# Patient Record
Sex: Male | Born: 1980 | Race: White | Hispanic: No | Smoking: Current every day smoker
Health system: Southern US, Community
[De-identification: ages and names within clinical notes are randomized; demographics above are authoritative.]

## PROBLEM LIST (undated history)

## (undated) DIAGNOSIS — F17200 Nicotine dependence, unspecified, uncomplicated: Secondary | ICD-10-CM

## (undated) DIAGNOSIS — S129XXA Fracture of neck, unspecified, initial encounter: Secondary | ICD-10-CM

## (undated) DIAGNOSIS — S46212A Strain of muscle, fascia and tendon of other parts of biceps, left arm, initial encounter: Secondary | ICD-10-CM

## (undated) HISTORY — PX: TONSILLECTOMY: SUR1361

---

## 2006-08-29 ENCOUNTER — Emergency Department (HOSPITAL_COMMUNITY): Admission: EM | Admit: 2006-08-29 | Discharge: 2006-08-29 | Payer: Self-pay | Admitting: Family Medicine

## 2007-01-11 ENCOUNTER — Emergency Department (HOSPITAL_COMMUNITY): Admission: EM | Admit: 2007-01-11 | Discharge: 2007-01-11 | Payer: Self-pay | Admitting: Emergency Medicine

## 2007-01-16 ENCOUNTER — Ambulatory Visit (HOSPITAL_COMMUNITY): Admission: RE | Admit: 2007-01-16 | Discharge: 2007-01-16 | Payer: Self-pay | Admitting: Orthopaedic Surgery

## 2009-11-16 ENCOUNTER — Emergency Department (HOSPITAL_COMMUNITY): Admission: EM | Admit: 2009-11-16 | Discharge: 2009-11-16 | Payer: Self-pay | Admitting: Emergency Medicine

## 2010-08-14 ENCOUNTER — Inpatient Hospital Stay (HOSPITAL_COMMUNITY)
Admission: EM | Admit: 2010-08-14 | Discharge: 2010-08-20 | Payer: Self-pay | Source: Home / Self Care | Admitting: Emergency Medicine

## 2010-08-14 ENCOUNTER — Encounter: Payer: Self-pay | Admitting: Emergency Medicine

## 2010-11-18 ENCOUNTER — Other Ambulatory Visit (HOSPITAL_COMMUNITY): Payer: Self-pay | Admitting: Neurosurgery

## 2010-11-18 DIAGNOSIS — T148XXA Other injury of unspecified body region, initial encounter: Secondary | ICD-10-CM

## 2010-11-19 ENCOUNTER — Ambulatory Visit (HOSPITAL_COMMUNITY): Admission: RE | Admit: 2010-11-19 | Payer: Self-pay | Source: Ambulatory Visit

## 2010-11-19 ENCOUNTER — Other Ambulatory Visit (HOSPITAL_COMMUNITY): Payer: Self-pay | Admitting: Neurosurgery

## 2010-11-19 ENCOUNTER — Ambulatory Visit (HOSPITAL_COMMUNITY)
Admission: RE | Admit: 2010-11-19 | Discharge: 2010-11-19 | Disposition: A | Discharge status: Home / Self Care | Payer: Self-pay | Source: Ambulatory Visit | Attending: Neurosurgery | Admitting: Neurosurgery

## 2010-11-19 DIAGNOSIS — M8448XA Pathological fracture, other site, initial encounter for fracture: Secondary | ICD-10-CM | POA: Insufficient documentation

## 2010-11-19 DIAGNOSIS — T148XXA Other injury of unspecified body region, initial encounter: Secondary | ICD-10-CM

## 2010-11-19 DIAGNOSIS — S129XXA Fracture of neck, unspecified, initial encounter: Secondary | ICD-10-CM

## 2010-11-19 DIAGNOSIS — S22009A Unspecified fracture of unspecified thoracic vertebra, initial encounter for closed fracture: Secondary | ICD-10-CM

## 2010-12-14 LAB — BASIC METABOLIC PANEL
CO2: 27 mEq/L (ref 19–32)
Chloride: 99 mEq/L (ref 96–112)
GFR calc non Af Amer: >60 mL/min (ref >60)
Glucose, Bld: 126 mg/dL — ABNORMAL HIGH (ref 70–99)
Glucose, Bld: 119 mg/dL — ABNORMAL HIGH (ref 70–99)
Potassium: 4.3 mEq/L (ref 3.5–5.1)
Potassium: 3.9 mEq/L (ref 3.5–5.1)
Sodium: 137 mEq/L (ref 135–145)
Sodium: 139 mEq/L (ref 135–145)

## 2010-12-14 LAB — URINE MICROSCOPIC-ADD ON

## 2010-12-14 LAB — URINALYSIS, ROUTINE W REFLEX MICROSCOPIC
Bilirubin Urine: NEGATIVE
Specific Gravity, Urine: <1.005 — ABNORMAL LOW (ref 1.005–1.030)
Urobilinogen, UA: 0.2 mg/dL (ref 0.0–1.0)

## 2010-12-14 LAB — DIFFERENTIAL
Basophils Relative: 0 % (ref 0–1)
Eosinophils Absolute: 0 10*3/uL (ref 0.0–0.7)
Monocytes Absolute: 0.4 10*3/uL (ref 0.1–1.0)
Monocytes Relative: 3 % (ref 3–12)

## 2010-12-14 LAB — CBC
HCT: 43.7 % (ref 39.0–52.0)
HCT: 49.1 % (ref 39.0–52.0)
Hemoglobin: 14.7 g/dL (ref 13.0–17.0)
Hemoglobin: 16.6 g/dL (ref 13.0–17.0)
MCH: 32.8 pg (ref 26.0–34.0)
MCHC: 33.7 g/dL (ref 30.0–36.0)
MCV: 97.2 fL (ref 78.0–100.0)
WBC: 7.2 10*3/uL (ref 4.0–10.5)

## 2010-12-14 LAB — RAPID URINE DRUG SCREEN, HOSP PERFORMED
Cocaine: NOT DETECTED
Opiates: NOT DETECTED
Tetrahydrocannabinol: NOT DETECTED

## 2011-10-14 ENCOUNTER — Emergency Department (HOSPITAL_COMMUNITY)
Admission: EM | Admit: 2011-10-14 | Discharge: 2011-10-14 | Disposition: A | Discharge status: Home / Self Care | Payer: Self-pay | Attending: Emergency Medicine | Admitting: Emergency Medicine

## 2011-10-14 ENCOUNTER — Encounter (HOSPITAL_COMMUNITY): Payer: Self-pay | Admitting: Emergency Medicine

## 2011-10-14 DIAGNOSIS — K0889 Other specified disorders of teeth and supporting structures: Secondary | ICD-10-CM

## 2011-10-14 DIAGNOSIS — K029 Dental caries, unspecified: Secondary | ICD-10-CM | POA: Insufficient documentation

## 2011-10-14 DIAGNOSIS — S025XXA Fracture of tooth (traumatic), initial encounter for closed fracture: Secondary | ICD-10-CM | POA: Insufficient documentation

## 2011-10-14 DIAGNOSIS — X58XXXA Exposure to other specified factors, initial encounter: Secondary | ICD-10-CM | POA: Insufficient documentation

## 2011-10-14 DIAGNOSIS — K089 Disorder of teeth and supporting structures, unspecified: Secondary | ICD-10-CM | POA: Insufficient documentation

## 2011-10-14 MED ORDER — HYDROCODONE-ACETAMINOPHEN 5-500 MG PO TABS: 1.0000 | ORAL_TABLET | Freq: Four times a day (QID) | ORAL | Status: AC | PRN

## 2011-10-14 MED ORDER — CLINDAMYCIN HCL 150 MG PO CAPS: 300.0000 mg | ORAL_CAPSULE | Freq: Three times a day (TID) | ORAL | Status: AC

## 2011-10-14 MED ORDER — NAPROXEN 500 MG PO TABS: 500.0000 mg | ORAL_TABLET | Freq: Two times a day (BID) | ORAL | Status: AC

## 2011-10-14 MED ORDER — HYDROCODONE-ACETAMINOPHEN 5-325 MG PO TABS
2.0000 | ORAL_TABLET | Freq: Once | ORAL | Status: AC
  Administered 2011-10-14: 2 via ORAL
  Filled 2011-10-14: qty 2

## 2011-10-14 NOTE — ED Provider Notes (Signed)
History     CSN: [id number]  Arrival date & time 10/14/11  0449   First MD Initiated Contact with Patient 10/14/11 0510      Chief Complaint  Patient presents with  . Dental Pain    (Consider location/radiation/quality/duration/timing/severity/associated sxs/prior treatment) HPI Comments: 31 year old male with no significant past medical history who presents with several weeks of left lower second molar pain. It has been mild, waxes and wanes and is worse with chewing. This evening approximately 4 hours prior to evaluation the patient noted significant increase in the pain. It is not associated with fevers nausea vomiting or swelling. Symptoms are persistent and he has not been evaluated by a dentist for same pain. He has tried ibuprofen prior to arrival with mild  improvement  Patient is a 31 y.o. male presenting with tooth pain. The history is provided by the patient and a friend.  Dental PainPrimary symptoms do not include fever or sore throat.  Additional symptoms do not include: facial swelling and trouble swallowing.    History reviewed. No pertinent past medical history.  History reviewed. No pertinent past surgical history.  No family history on file.  History  Substance Use Topics  . Smoking status: Current Everyday Smoker -- 1.0 packs/day    Types: Cigarettes  . Smokeless tobacco: Not on file  . Alcohol Use: Yes      Review of Systems  Constitutional: Negative for fever and chills.  HENT: Positive for dental problem. Negative for sore throat, facial swelling, trouble swallowing and voice change.        Toothache  Gastrointestinal: Negative for nausea and vomiting.    Allergies  Penicillins  Home Medications   Current Outpatient Rx  Name Route Sig Dispense Refill  . CLINDAMYCIN HCL 150 MG PO CAPS Oral Take 2 capsules (300 mg total) by mouth 3 (three) times daily. May dispense as 150mg  capsules 60 capsule 0  . HYDROCODONE-ACETAMINOPHEN 5-500 MG PO TABS  Oral Take 1-2 tablets by mouth every 6 (six) hours as needed for pain. 15 tablet 0  . NAPROXEN 500 MG PO TABS Oral Take 1 tablet (500 mg total) by mouth 2 (two) times daily with a meal. 30 tablet 0    BP 136/87  Pulse 67  Temp(Src) 98 F (36.7 C) (Oral)  Resp 20  Ht 6\' 3"  (1.905 m)  Wt 220 lb (99.791 kg)  BMI 27.50 kg/m2  SpO2 100%  Physical Exam  Nursing note and vitals reviewed. Constitutional: He appears well-developed and well-nourished. No distress.  HENT:  Head: Normocephalic and atraumatic.  Mouth/Throat: Oropharynx is clear and moist. No oropharyngeal exudate.       Dental Disease - scattered caries, small dental fracture to left lower second molar, no periapical abscess, swelling. No sublingual tenderness or pain. No asymmetry or tenderness to the mandible  Eyes: Conjunctivae are normal. No scleral icterus.  Neck: Normal range of motion. Neck supple. No thyromegaly present.  Cardiovascular: Normal rate and regular rhythm.   Pulmonary/Chest: Effort normal and breath sounds normal.  Lymphadenopathy:    He has no cervical adenopathy.  Neurological: He is alert.  Skin: Skin is warm and dry. No rash noted. He is not diaphoretic.    ED Course  Procedures (including critical care time)  Labs Reviewed - No data to display No results found.   1. Fracture of tooth   2. Pain, dental       MDM  Patient has apparent dental fracture, there is no signs of  abscess. He has a penicillin allergy and thus I will prescribe clindamycin. He will also be treated with hydrocodone and Naprosyn. I have given dental contact information for followup.  There is no physical evidence to suggest a Ludwig angina and the pharynx is widely patent. Vital signs reveal no tachycardia or fever.  Discharge prescriptions  #1 Naprosyn #2 hydrocodone #3 clindamycin        Vida Roller, MD 10/14/11 [phone]

## 2011-10-14 NOTE — ED Notes (Signed)
Medicated as ordered for 10\10 pain. Denies any needs. No distress. Call bell and wife at bedside.

## 2011-10-14 NOTE — ED Notes (Signed)
Patient c/o left lower dental pain since 0130.

## 2012-10-14 ENCOUNTER — Encounter (HOSPITAL_COMMUNITY): Payer: Self-pay | Admitting: *Deleted

## 2012-10-14 ENCOUNTER — Emergency Department (HOSPITAL_COMMUNITY)
Admission: EM | Admit: 2012-10-14 | Discharge: 2012-10-14 | Disposition: A | Discharge status: Home / Self Care | Payer: No Typology Code available for payment source | Attending: Emergency Medicine | Admitting: Emergency Medicine

## 2012-10-14 ENCOUNTER — Emergency Department (HOSPITAL_COMMUNITY): Payer: No Typology Code available for payment source

## 2012-10-14 DIAGNOSIS — F17200 Nicotine dependence, unspecified, uncomplicated: Secondary | ICD-10-CM | POA: Insufficient documentation

## 2012-10-14 DIAGNOSIS — Z79899 Other long term (current) drug therapy: Secondary | ICD-10-CM | POA: Insufficient documentation

## 2012-10-14 DIAGNOSIS — Y9381 Activity, refereeing a sports activity: Secondary | ICD-10-CM | POA: Insufficient documentation

## 2012-10-14 DIAGNOSIS — Z8739 Personal history of other diseases of the musculoskeletal system and connective tissue: Secondary | ICD-10-CM | POA: Insufficient documentation

## 2012-10-14 DIAGNOSIS — S161XXA Strain of muscle, fascia and tendon at neck level, initial encounter: Secondary | ICD-10-CM

## 2012-10-14 DIAGNOSIS — S139XXA Sprain of joints and ligaments of unspecified parts of neck, initial encounter: Secondary | ICD-10-CM | POA: Insufficient documentation

## 2012-10-14 MED ORDER — ONDANSETRON 8 MG PO TBDP
8.0000 mg | ORAL_TABLET | Freq: Once | ORAL | Status: AC
  Administered 2012-10-14: 8 mg via ORAL
  Filled 2012-10-14: qty 1

## 2012-10-14 MED ORDER — CYCLOBENZAPRINE HCL 5 MG PO TABS: 5.0000 mg | ORAL_TABLET | Freq: Three times a day (TID) | ORAL | Status: DC | PRN

## 2012-10-14 MED ORDER — HYDROCODONE-ACETAMINOPHEN 5-325 MG PO TABS
1.0000 | ORAL_TABLET | Freq: Once | ORAL | Status: AC
  Administered 2012-10-14: 1 via ORAL
  Filled 2012-10-14: qty 1

## 2012-10-14 MED ORDER — HYDROCODONE-ACETAMINOPHEN 5-325 MG PO TABS: 1.0000 | ORAL_TABLET | ORAL | Status: DC | PRN
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
## 2012-10-19 NOTE — ED Provider Notes (Signed)
Medical screening examination/treatment/procedure(s) were performed by non-physician practitioner and as supervising physician I was immediately available for consultation/collaboration.   Delrick Dehart M Lowell Mcgurk, MD 10/19/12 2144 

## 2013-04-02 ENCOUNTER — Emergency Department (HOSPITAL_COMMUNITY): Payer: 59

## 2013-04-02 ENCOUNTER — Encounter (HOSPITAL_COMMUNITY): Payer: Self-pay | Admitting: *Deleted

## 2013-04-02 ENCOUNTER — Emergency Department (HOSPITAL_COMMUNITY)
Admission: EM | Admit: 2013-04-02 | Discharge: 2013-04-03 | Disposition: A | Discharge status: Home / Self Care | Payer: 59 | Attending: Emergency Medicine | Admitting: Emergency Medicine

## 2013-04-02 DIAGNOSIS — R079 Chest pain, unspecified: Secondary | ICD-10-CM | POA: Insufficient documentation

## 2013-04-02 DIAGNOSIS — R51 Headache: Secondary | ICD-10-CM | POA: Insufficient documentation

## 2013-04-02 DIAGNOSIS — R509 Fever, unspecified: Secondary | ICD-10-CM | POA: Insufficient documentation

## 2013-04-02 DIAGNOSIS — M542 Cervicalgia: Secondary | ICD-10-CM | POA: Insufficient documentation

## 2013-04-02 DIAGNOSIS — Z880 Allergy status to penicillin: Secondary | ICD-10-CM | POA: Insufficient documentation

## 2013-04-02 DIAGNOSIS — IMO0001 Reserved for inherently not codable concepts without codable children: Secondary | ICD-10-CM | POA: Insufficient documentation

## 2013-04-02 DIAGNOSIS — R110 Nausea: Secondary | ICD-10-CM | POA: Insufficient documentation

## 2013-04-02 DIAGNOSIS — F17200 Nicotine dependence, unspecified, uncomplicated: Secondary | ICD-10-CM | POA: Insufficient documentation

## 2013-04-02 DIAGNOSIS — H53149 Visual discomfort, unspecified: Secondary | ICD-10-CM | POA: Insufficient documentation

## 2013-04-02 DIAGNOSIS — Z8781 Personal history of (healed) traumatic fracture: Secondary | ICD-10-CM | POA: Insufficient documentation

## 2013-04-02 HISTORY — DX: Fracture of neck, unspecified, initial encounter: S12.9XXA

## 2013-04-02 LAB — URINALYSIS, ROUTINE W REFLEX MICROSCOPIC
Glucose, UA: NEGATIVE mg/dL
Hgb urine dipstick: NEGATIVE
Leukocytes, UA: NEGATIVE
Specific Gravity, Urine: 1.01 (ref 1.005–1.030)

## 2013-04-02 LAB — CBC WITH DIFFERENTIAL/PLATELET
Basophils Absolute: 0 10*3/uL (ref 0.0–0.1)
Basophils Relative: 0 % (ref 0–1)
Eosinophils Absolute: 0 10*3/uL (ref 0.0–0.7)
Eosinophils Relative: 0 % (ref 0–5)
HCT: 43.4 % (ref 39.0–52.0)
MCH: 32.2 pg (ref 26.0–34.0)
MCHC: 35.7 g/dL (ref 30.0–36.0)
MCV: 90.2 fL (ref 78.0–100.0)
Monocytes Absolute: 0.3 10*3/uL (ref 0.1–1.0)
Neutro Abs: 1.4 10*3/uL — ABNORMAL LOW (ref 1.7–7.7)
RDW: 12.4 % (ref 11.5–15.5)

## 2013-04-02 LAB — COMPREHENSIVE METABOLIC PANEL
AST: 40 U/L — ABNORMAL HIGH (ref 0–37)
Albumin: 3.2 g/dL — ABNORMAL LOW (ref 3.5–5.2)
Calcium: 8.8 mg/dL (ref 8.4–10.5)
Creatinine, Ser: 0.91 mg/dL (ref 0.50–1.35)
Total Protein: 6 g/dL (ref 6.0–8.3)

## 2013-04-02 LAB — PROTEIN AND GLUCOSE, CSF: Total  Protein, CSF: 28 mg/dL (ref 15–45)

## 2013-04-02 LAB — CSF CELL COUNT WITH DIFFERENTIAL: Tube #: 1

## 2013-04-02 LAB — URINE MICROSCOPIC-ADD ON

## 2013-04-02 MED ORDER — MIDAZOLAM HCL 2 MG/2ML IJ SOLN
2.0000 mg | Freq: Once | INTRAMUSCULAR | Status: DC
  Filled 2013-04-02: qty 4

## 2013-04-02 MED ORDER — HYDROMORPHONE HCL PF 1 MG/ML IJ SOLN: 1.0000 mg | Freq: Once | INTRAMUSCULAR | Status: AC

## 2013-04-02 MED ORDER — MORPHINE SULFATE 4 MG/ML IJ SOLN
4.0000 mg | Freq: Once | INTRAMUSCULAR | Status: AC
  Administered 2013-04-02: 4 mg via INTRAVENOUS
  Filled 2013-04-02: qty 1

## 2013-04-02 MED ORDER — PROMETHAZINE HCL 25 MG PO TABS: 25.0000 mg | ORAL_TABLET | Freq: Four times a day (QID) | ORAL | Status: DC | PRN

## 2013-04-02 MED ORDER — HYDROMORPHONE HCL PF 1 MG/ML IJ SOLN
1.0000 mg | Freq: Once | INTRAMUSCULAR | Status: AC
  Administered 2013-04-02: 1 mg via INTRAVENOUS
  Filled 2013-04-02: qty 1

## 2013-04-02 MED ORDER — HYDROMORPHONE HCL PF 1 MG/ML IJ SOLN
1.0000 mg | Freq: Once | INTRAMUSCULAR | Status: AC
  Administered 2013-04-02: 1 mg via INTRAVENOUS

## 2013-04-02 MED ORDER — DOXYCYCLINE HYCLATE 100 MG PO CAPS: 100.0000 mg | ORAL_CAPSULE | Freq: Two times a day (BID) | ORAL | Status: DC

## 2013-04-02 MED ORDER — HYDROCODONE-ACETAMINOPHEN 5-325 MG PO TABS: 1.0000 | ORAL_TABLET | Freq: Four times a day (QID) | ORAL | Status: DC | PRN

## 2013-04-02 MED ORDER — METOCLOPRAMIDE HCL 5 MG/ML IJ SOLN
10.0000 mg | Freq: Once | INTRAMUSCULAR | Status: AC
  Administered 2013-04-02: 10 mg via INTRAVENOUS
  Filled 2013-04-02: qty 2

## 2013-04-02 MED ORDER — HYDROMORPHONE HCL PF 1 MG/ML IJ SOLN
INTRAMUSCULAR | Status: AC
  Administered 2013-04-02: 1 mg via INTRAVENOUS
  Filled 2013-04-02: qty 1

## 2013-04-02 MED ORDER — DEXAMETHASONE SODIUM PHOSPHATE 4 MG/ML IJ SOLN
10.0000 mg | Freq: Once | INTRAMUSCULAR | Status: AC
  Administered 2013-04-02: 10 mg via INTRAVENOUS
  Filled 2013-04-02: qty 3

## 2013-04-02 MED ORDER — SODIUM CHLORIDE 0.9 % IV SOLN
INTRAVENOUS | Status: AC | PRN
  Administered 2013-04-02: 1000 mL via INTRAVENOUS

## 2013-04-02 MED ORDER — MIDAZOLAM HCL 2 MG/2ML IJ SOLN
4.0000 mg | Freq: Once | INTRAMUSCULAR | Status: AC
  Administered 2013-04-02: 4 mg via INTRAVENOUS

## 2013-04-02 MED ORDER — ONDANSETRON HCL 4 MG/2ML IJ SOLN
4.0000 mg | Freq: Once | INTRAMUSCULAR | Status: AC
  Administered 2013-04-02: 4 mg via INTRAVENOUS
  Filled 2013-04-02: qty 2

## 2013-04-02 MED ORDER — DIPHENHYDRAMINE HCL 50 MG/ML IJ SOLN
25.0000 mg | Freq: Once | INTRAMUSCULAR | Status: AC
Start: 2013-04-02 — End: 2013-04-02
  Administered 2013-04-02: 25 mg via INTRAVENOUS
  Filled 2013-04-02: qty 1

## 2013-04-02 MED ORDER — SODIUM CHLORIDE 0.9 % IV SOLN
Freq: Once | INTRAVENOUS | Status: AC
  Administered 2013-04-02: 11:00:00 via INTRAVENOUS

## 2013-04-02 NOTE — ED Notes (Signed)
Pt. Con't. To c/o L upper chest pain 8/10, worse w/deep breath.  Dr. Estell Harpin made aware. He gave no further orders.  Patient and family made aware.

## 2013-04-02 NOTE — ED Notes (Signed)
Dr. Lynelle Doctor attempted LP unsuccessfully.  Patient stable.  Giving 1 L NS for SBP in the 90's

## 2013-04-02 NOTE — ED Notes (Signed)
HOB flat.

## 2013-04-02 NOTE — ED Notes (Signed)
Patient with no complaints at this time. Respirations even and unlabored. Skin warm/dry. Discharge instructions reviewed with patient at this time. Patient given opportunity to voice concerns/ask questions. IV removed per policy and band-aid applied to site. Patient discharged at this time and left Emergency Department with steady gait.  
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
## 2013-04-02 NOTE — ED Notes (Signed)
Pt c/o headache, fever as high as 104, nausea, chest pain at home that started Friday, headache is light sensitive,

## 2013-04-02 NOTE — ED Provider Notes (Signed)
Patient complains he started feeling bad 5 days ago with mild headache with neck stiffness The following day his headaches are getting worse and states his headache is at the top and back of his head. He also had fever up to 104. He denies any tick bites that he is aware of. He has had nausea without vomiting. He states he's never felt this way before.  Pt states his headache was better after the headache cocktail.   We discussed need for lumbar puncture to get spinal fluid to test for meningitis.  The patient was presedation given morphine and Versed. Timeout was called. Patient was laid on his right side. He was placed in the appropriate position. His back was cleaned with Betadine swabs x3. He was sterilely draped. The area was reaffirmed. He was injected with 1% Xylocaine and it appeared to be the appropriate angle to obtain fluid. However when I used a 20-gauge needle there was no fluid obtained. At one point there was blood he returned and attempts were stopped.  D/W Dr Tyron Russell he will do under flouroscopy  Pt turned over at change of shift to Dr Estell Harpin to get CSF results.   Dg Chest 2 View  04/02/2013   *RADIOLOGY REPORT*  Clinical Data: Headache and fevers  CHEST - 2 VIEW  Comparison: Chest CT August 14, 2010  Findings:  There is no edema or consolidation.  Heart size and pulmonary vascularity are normal.  No adenopathy.  There is mild anterior wedging of the T5 vertebral body.  IMPRESSION: No edema or consolidation.   Original Report Authenticated By: Bretta Bang, M.D.   Ct Head Wo Contrast  04/02/2013   *RADIOLOGY REPORT*  Clinical Data: Headache, fever, nausea  CT HEAD WITHOUT CONTRAST  Technique:  Contiguous axial images were obtained from the base of the skull through the vertex without contrast.  Comparison: CT head dated 08/14/2010  Findings: No evidence of parenchymal hemorrhage or extra-axial fluid collection. No mass lesion, mass effect, or midline shift.  No CT evidence of  acute infarction.  Cerebral volume is age appropriate.  No ventriculomegaly.  The visualized paranasal sinuses are essentially clear. The mastoid air cells are unopacified.  No evidence of calvarial fracture.  IMPRESSION: No evidence of acute intracranial abnormality.   Original Report Authenticated By: Charline Bills, M.D.      Diagnoses that have been ruled out:  None  Diagnoses that are still under consideration:  None  Final diagnoses:  Fever  Headache   Disposition pending   Medical screening examination/treatment/procedure(s) were conducted as a shared visit with non-physician practitioner(s) and myself.  I personally evaluated the patient during the encounter  Devoria Albe, MD, Franz Dell, MD 04/02/13 [phone]

## 2013-04-03 NOTE — ED Provider Notes (Signed)
Medical screening examination/treatment/procedure(s) were performed by non-physician practitioner and as supervising physician I was immediately available for consultation/collaboration. Devoria Albe, MD, FACEP   Ward Givens, MD 04/03/13 [phone]

## 2013-04-03 NOTE — Procedures (Signed)
Preprocedure Dx: Headache, fever Postprocedure Dx: HEadache, fever Procedure:  Fluoroscopically guided lumbar puncture Radiologist:  Tyron Russell Anesthesia:  2 ml of 1% lidocaine Specimen:  8.5 ml CSF, clear colorless EBL:   None Opening pressure: 17 cm H2O Complications: None

## 2013-04-07 LAB — CSF CULTURE W GRAM STAIN: Culture: NO GROWTH

## 2014-08-03 IMAGING — CR DG CHEST 2V
2 series · 2 of 2 positions shown · non-contrast
Comparison: Chest CT August 14, 2010

CLINICAL DATA: Headache and fevers

CHEST - 2 VIEW

[view not recorded (1 of 2)]
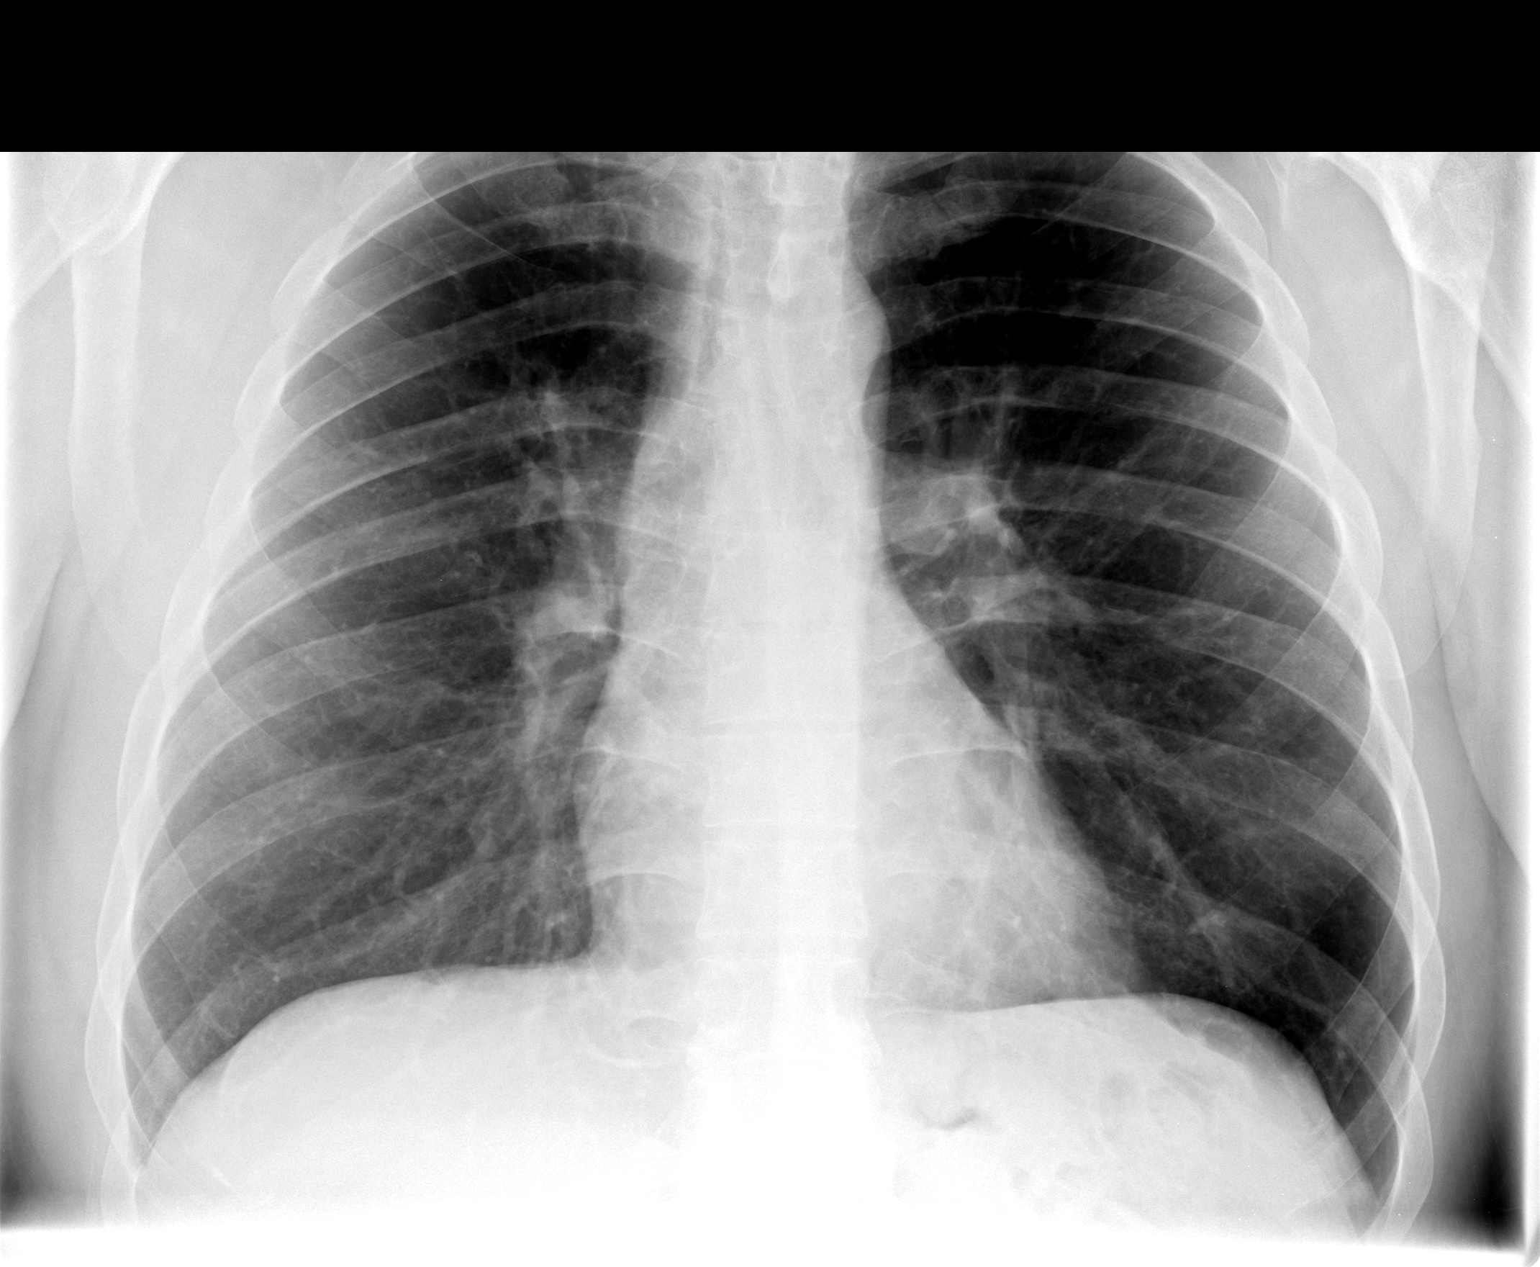

[view not recorded (2 of 2)]
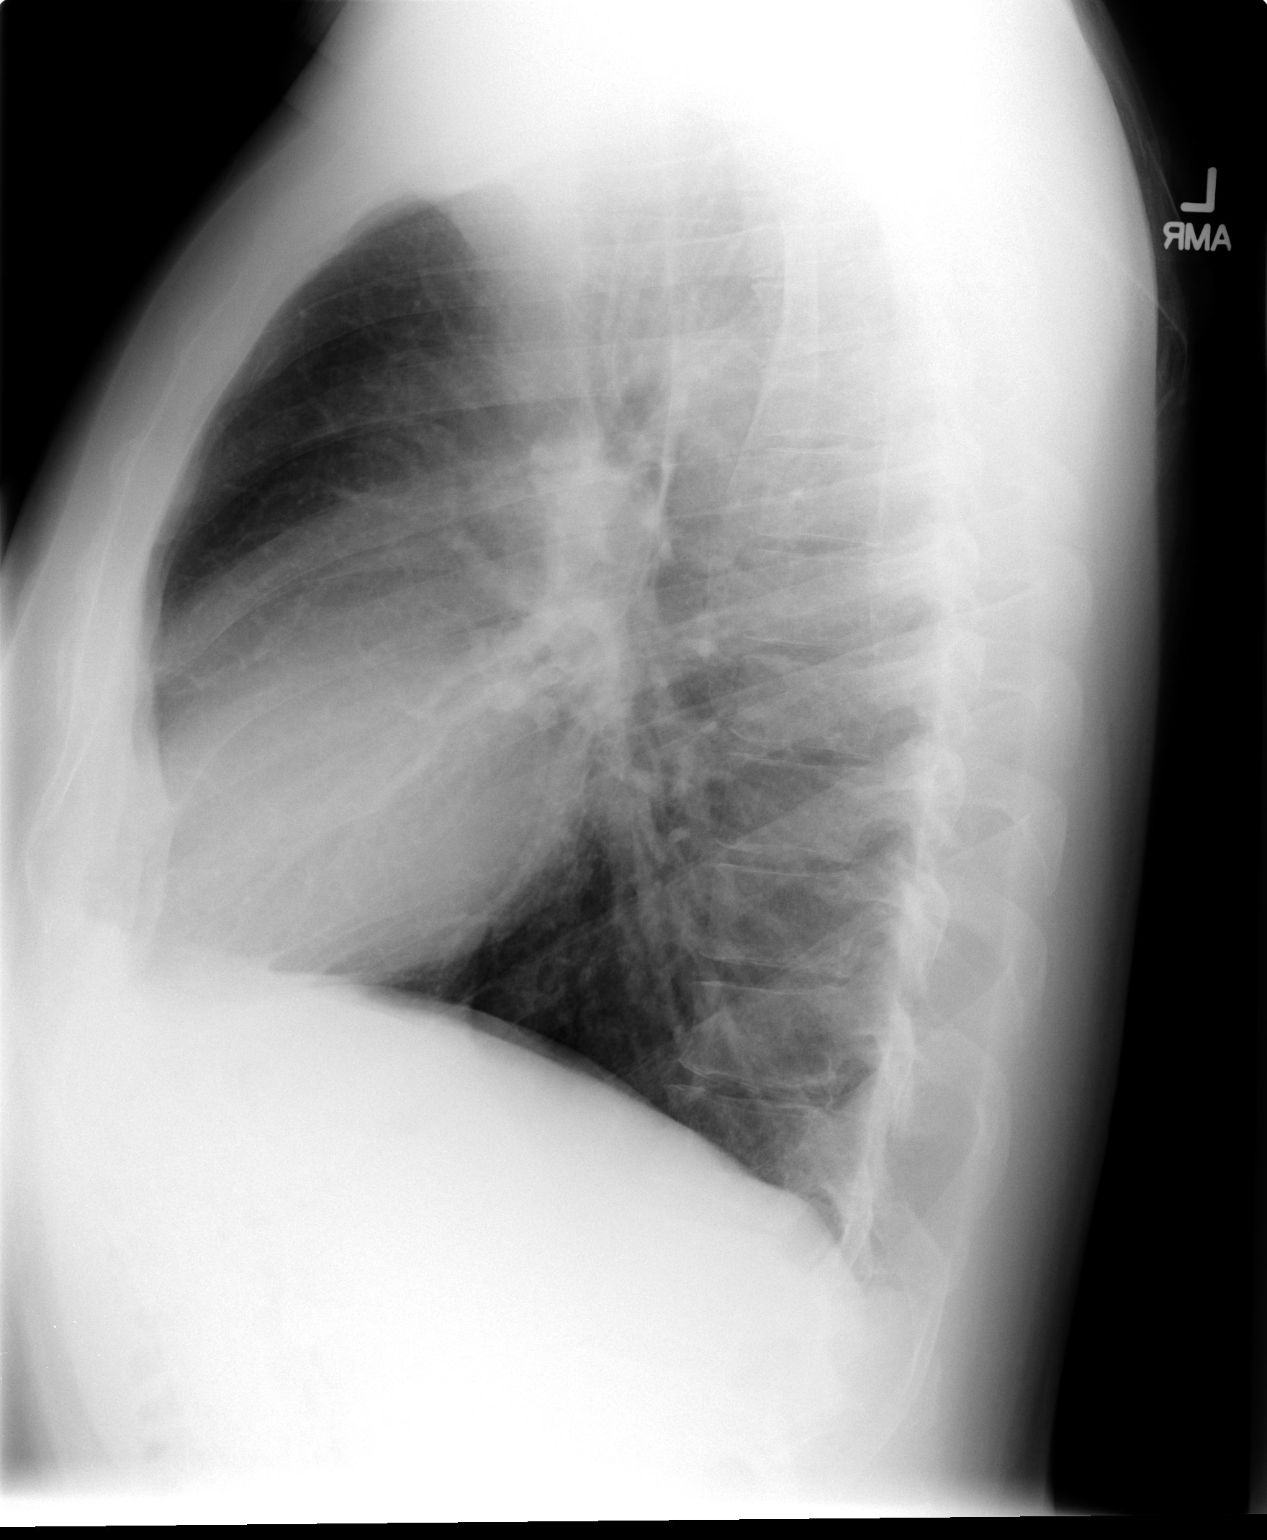

[2 of 2 positions shown; findings below may reference images not displayed]

FINDINGS: There is no edema or consolidation.  Heart size and
pulmonary vascularity are normal.  No adenopathy.  There is mild
anterior wedging of the T5 vertebral body.
IMPRESSION: No edema or consolidation.

## 2014-08-03 IMAGING — RF DG FLUORO GUIDE LUMBAR PUNCTURE
1 series · 1 of 1 positions shown · non-contrast
Comparison: none

CLINICAL DATA: Headache, fever

FLUOROSCOPIC GUIDED LUMBAR PUNCTURE:
TECHNIQUE: Written informed consent for fluoroscopic-guided lumbar puncture
was obtained.
Patient was placed prone.
L4-L5 disc space was localized under fluoroscopy.
Skin prepped and draped in usual sterile fashion.
Skin and soft tissues anesthetized with 2 ml of 1% lidocaine.
22 gauge spinal needle advanced into spinal canal.
Clear colorless CSF was encountered with opening pressure of 17 cm
H2O.
8.5 ml of CSF was obtained in four tubes for requested laboratory
analysis.
Procedure tolerated well by patient without immediate complication.
Fluoroscopy time: 0 minutes 24 seconds

[Series 1: run · 1 of 1 slices shown]
[im 1/1]
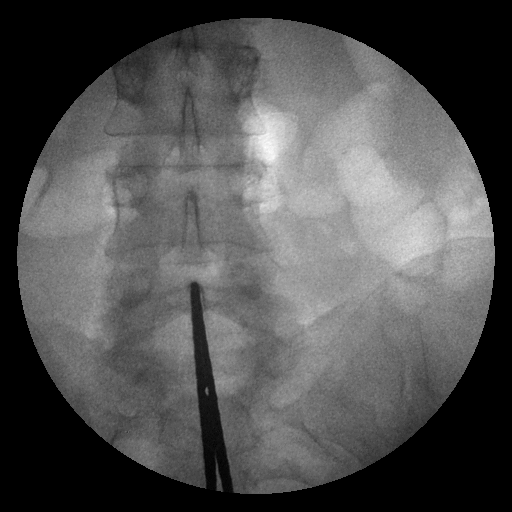

[1 of 1 positions shown; findings below may reference images not displayed]

IMPRESSION: Fluoroscopic guided lumbar puncture as above.
Fluid sent to laboratory for requested analysis.

## 2014-08-03 IMAGING — CT CT HEAD W/O CM
1 series · 16 of 30 positions shown, 20 images · non-contrast
Comparison: CT head dated 08/14/2010

CLINICAL DATA: Headache, fever, nausea

CT HEAD WITHOUT CONTRAST
TECHNIQUE: Contiguous axial images were obtained from the base of
the skull through the vertex without contrast.

[Series 2: headseq 4.8 h37s · axial · 0.44mm/px · z∈[+80,+238]mm · 16 of 36 slices shown, 20 images]
[im 2/36  brain]
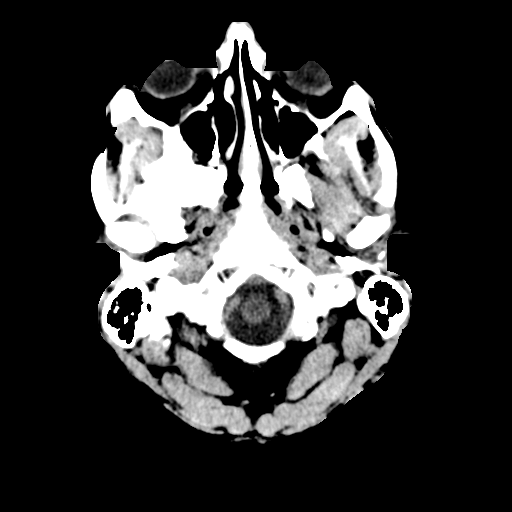
[im 2/36  bone]
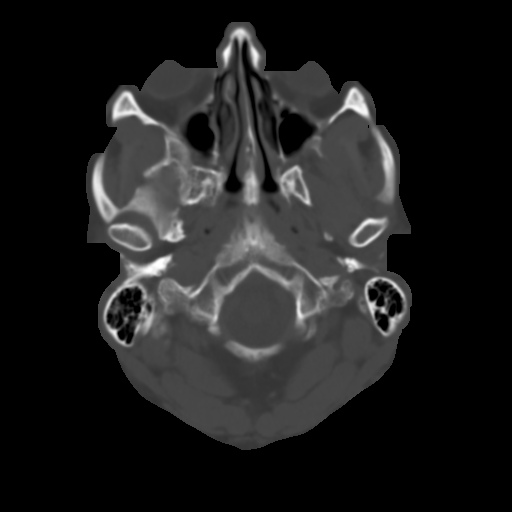
[im 4/36  brain]
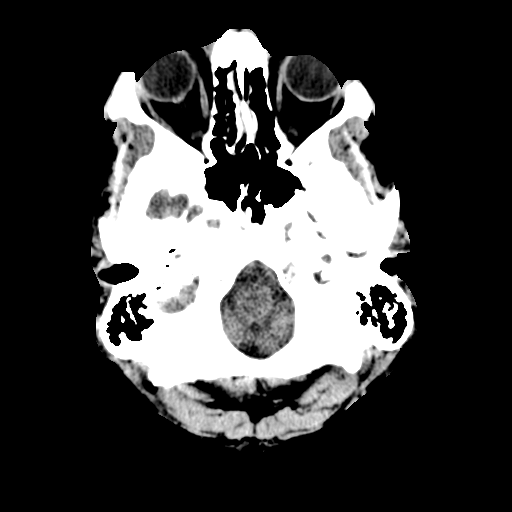
[im 7/36  brain]
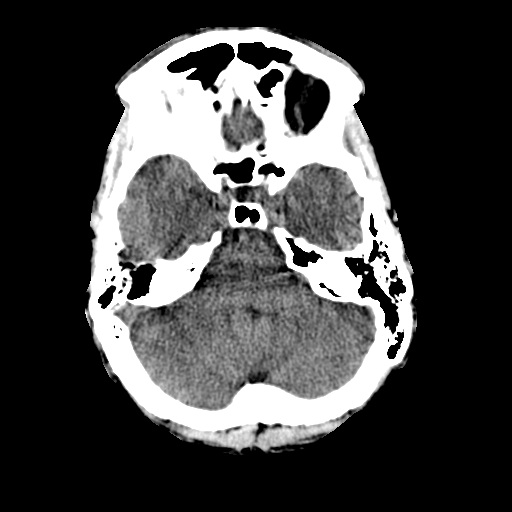
[im 9/36  brain]
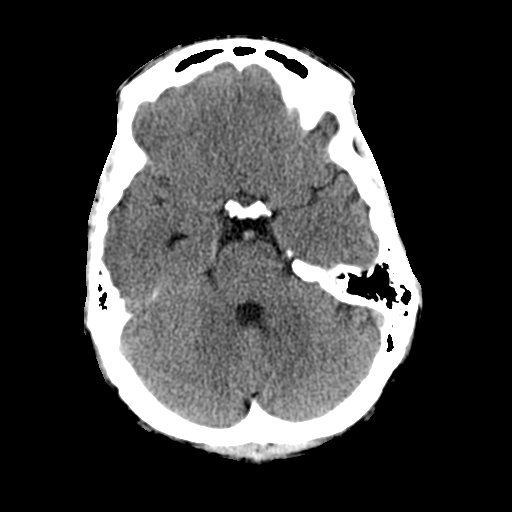
[im 10/36  brain]
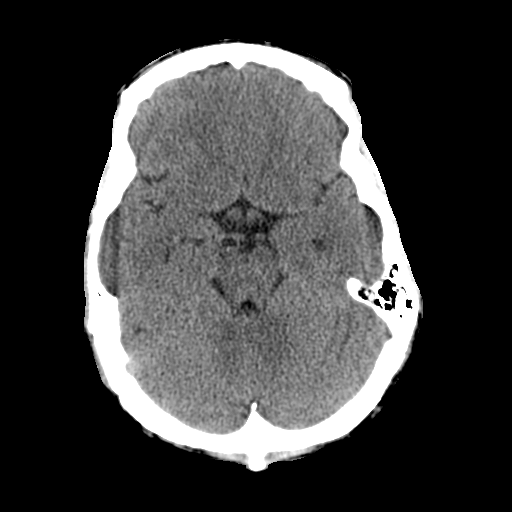
[im 10/36  bone]
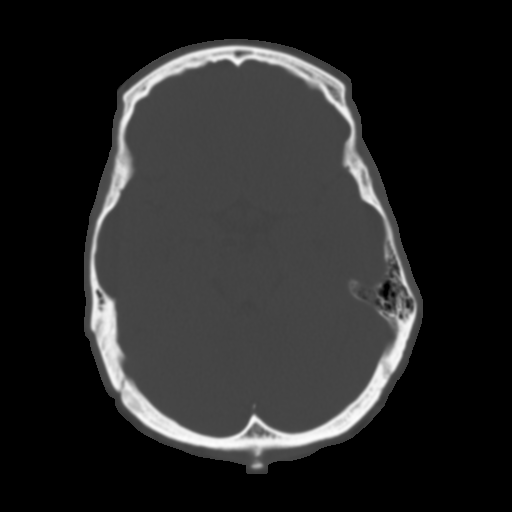
[im 13/36  brain]
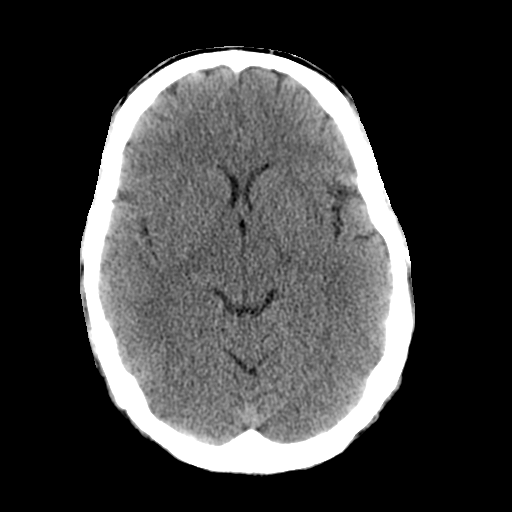
[im 15/36  brain]
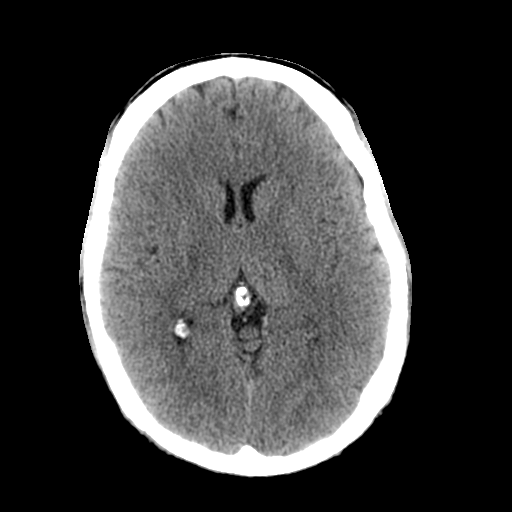
[im 17/36  brain]
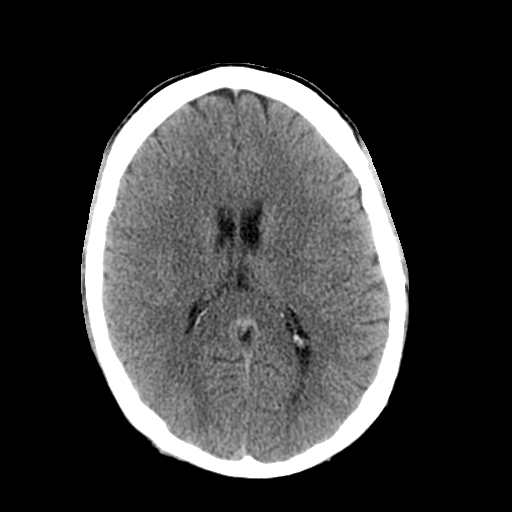
[im 19/36  brain]
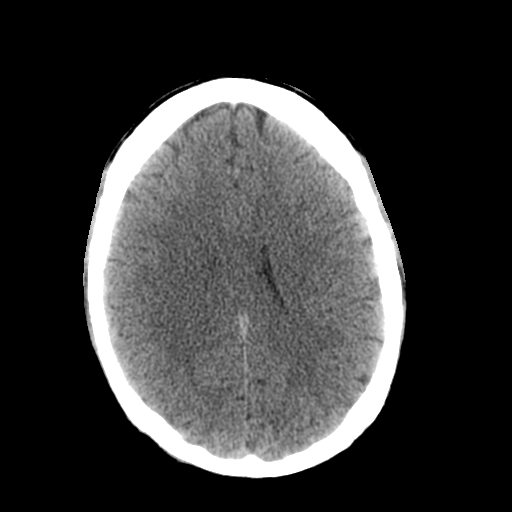
[im 19/36  bone]
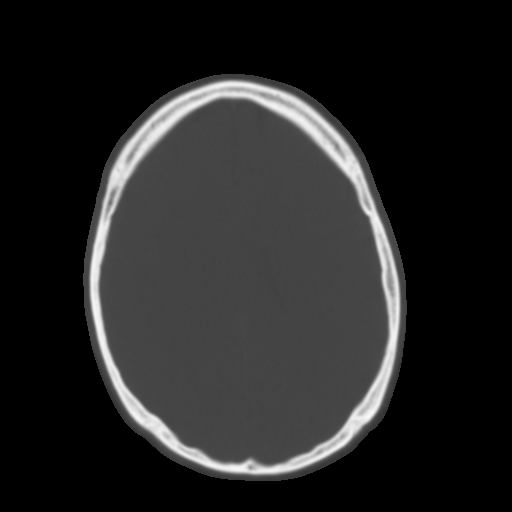
[im 21/36  brain]
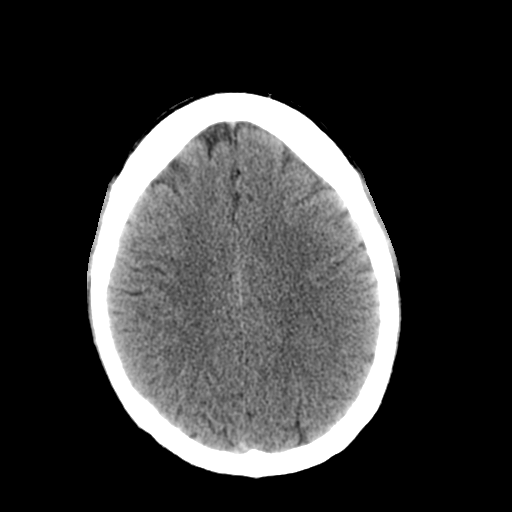
[im 23/36  brain]
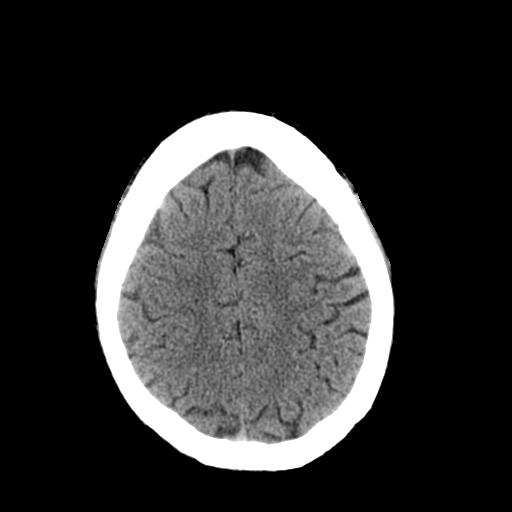
[im 26/36  brain]
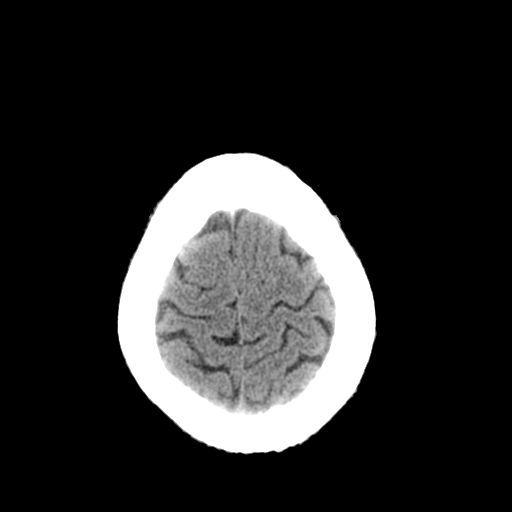
[im 27/36  brain]
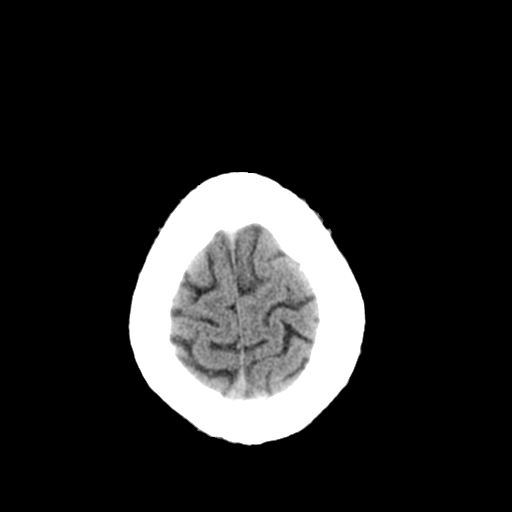
[im 27/36  bone]
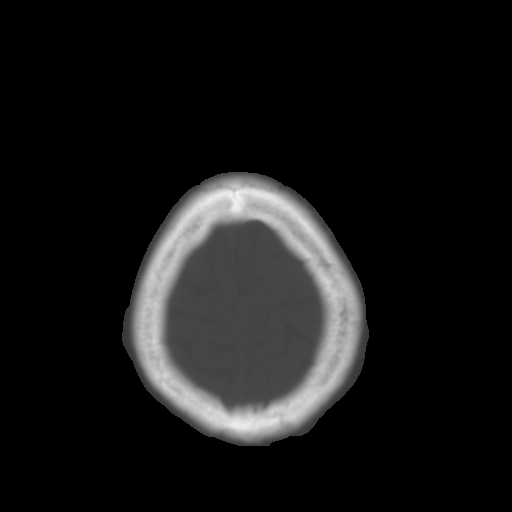
[im 29/36  brain]
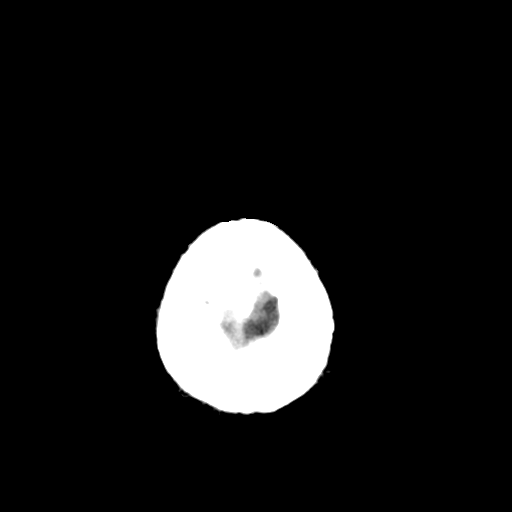
[im 32/36  brain]
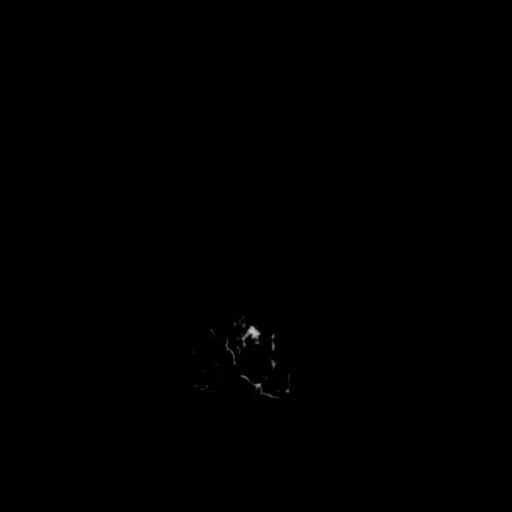
[im 34/36  brain]
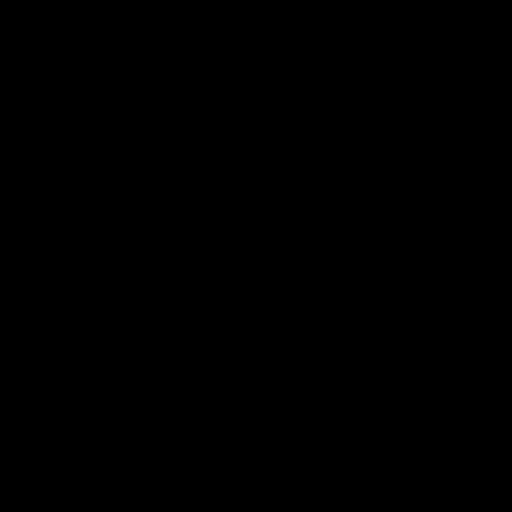

[16 of 30 positions shown; findings below may reference images not displayed]

FINDINGS: No evidence of parenchymal hemorrhage or extra-axial
fluid collection. No mass lesion, mass effect, or midline shift.

No CT evidence of acute infarction.

Cerebral volume is age appropriate.  No ventriculomegaly.

The visualized paranasal sinuses are essentially clear. The mastoid
air cells are unopacified.

No evidence of calvarial fracture.
IMPRESSION: No evidence of acute intracranial abnormality.

## 2014-10-21 ENCOUNTER — Encounter (HOSPITAL_COMMUNITY): Payer: Self-pay | Admitting: Emergency Medicine

## 2014-10-21 ENCOUNTER — Emergency Department (HOSPITAL_COMMUNITY): Payer: No Typology Code available for payment source

## 2014-10-21 ENCOUNTER — Emergency Department (HOSPITAL_COMMUNITY)
Admission: EM | Admit: 2014-10-21 | Discharge: 2014-10-21 | Disposition: A | Discharge status: Home / Self Care | Payer: No Typology Code available for payment source | Attending: Emergency Medicine | Admitting: Emergency Medicine

## 2014-10-21 DIAGNOSIS — Z880 Allergy status to penicillin: Secondary | ICD-10-CM | POA: Insufficient documentation

## 2014-10-21 DIAGNOSIS — Z792 Long term (current) use of antibiotics: Secondary | ICD-10-CM | POA: Insufficient documentation

## 2014-10-21 DIAGNOSIS — S53402A Unspecified sprain of left elbow, initial encounter: Secondary | ICD-10-CM

## 2014-10-21 DIAGNOSIS — Z8781 Personal history of (healed) traumatic fracture: Secondary | ICD-10-CM | POA: Insufficient documentation

## 2014-10-21 DIAGNOSIS — Y998 Other external cause status: Secondary | ICD-10-CM | POA: Insufficient documentation

## 2014-10-21 DIAGNOSIS — R52 Pain, unspecified: Secondary | ICD-10-CM

## 2014-10-21 DIAGNOSIS — Z79899 Other long term (current) drug therapy: Secondary | ICD-10-CM | POA: Insufficient documentation

## 2014-10-21 DIAGNOSIS — X58XXXA Exposure to other specified factors, initial encounter: Secondary | ICD-10-CM | POA: Insufficient documentation

## 2014-10-21 DIAGNOSIS — Z720 Tobacco use: Secondary | ICD-10-CM | POA: Insufficient documentation

## 2014-10-21 DIAGNOSIS — Y9389 Activity, other specified: Secondary | ICD-10-CM | POA: Insufficient documentation

## 2014-10-21 DIAGNOSIS — Y9289 Other specified places as the place of occurrence of the external cause: Secondary | ICD-10-CM | POA: Insufficient documentation

## 2014-10-21 MED ORDER — OXYCODONE-ACETAMINOPHEN 5-325 MG PO TABS: 1.0000 | ORAL_TABLET | ORAL | Status: AC | PRN

## 2014-10-21 MED ORDER — IBUPROFEN 800 MG PO TABS
800.0000 mg | ORAL_TABLET | Freq: Once | ORAL | Status: AC
  Administered 2014-10-21: 800 mg via ORAL
  Filled 2014-10-21: qty 1

## 2014-10-21 MED ORDER — NAPROXEN 500 MG PO TABS: 500.0000 mg | ORAL_TABLET | Freq: Two times a day (BID) | ORAL | Status: AC

## 2014-10-21 MED ORDER — OXYCODONE-ACETAMINOPHEN 5-325 MG PO TABS
1.0000 | ORAL_TABLET | Freq: Once | ORAL | Status: AC
  Administered 2014-10-21: 1 via ORAL
  Filled 2014-10-21: qty 1

## 2014-10-21 NOTE — Discharge Instructions (Signed)
Sprain °A sprain happens when the bands of tissue that connect bones and hold joints together (ligaments) stretch too much or tear. °HOME CARE °· Raise (elevate) the injured area to lessen puffiness (swelling). °· Put ice on the injured area 2 times a day for 2-3 days. °¨ Put ice in a plastic bag. °¨ Place a towel between your skin and the bag. °¨ Leave the ice on for 15 minutes. °· Only take medicine as told by your doctor. °· Protect your injured area until your pain and stiffness go away. °· Do not get your cast or splint wet. Cover your cast or splint with a plastic bag when you shower or take a bath. Do not swim in a pool. °· Your doctor may suggest exercises during your recovery to keep from getting stiff. °GET HELP RIGHT AWAY IF:  °· Your cast or splint becomes damaged. °· Your pain gets worse. °MAKE SURE YOU:  °· Understand these instructions. °· Will watch this condition. °· Will get help right away if you are not doing well or get worse. °Document Released: 03/07/2008 Document Revised: 07/10/2013 Document Reviewed: 10/01/2011 °[employer]® Patient Information ©2015 [employer]. This information is not intended to replace advice given to you by your health care provider. Make sure you discuss any questions you have with your health care provider. ° °

## 2014-10-21 NOTE — ED Notes (Signed)
Pt was moving a bed when he felt a "pop" in his elbow. No prior injury to elbow noted.

## 2014-10-21 NOTE — ED Provider Notes (Signed)
CSN: [id number]     Arrival date & time 10/21/14  1908 History   First MD Initiated Contact with Patient 10/21/14 1938     Chief Complaint  Patient presents with  . Elbow Pain     (Consider location/radiation/quality/duration/timing/severity/associated sxs/prior Treatment) HPI  Dylan Garza is a 34 y.o. male who presents to the Emergency Department complaining of sudden onset of left elbow pain.  He states that he was moving furniture when he felt a "pop" and severe pain.  Since the injury, he reports pain with attempted rotation and flexion.  Pain improves with the arm extended.  He has not tried any medication or therapies prior to ED arrival.  He denies swelling, shoulder or wrist pain, numbness or neck pain.  No recent antibiotics reported    Past Medical History  Diagnosis Date  . Cervical spine fracture    History reviewed. No pertinent past surgical history. No family history on file. History  Substance Use Topics  . Smoking status: Current Every Day Smoker -- 1.00 packs/day    Types: Cigarettes  . Smokeless tobacco: Not on file  . Alcohol Use: Yes    Review of Systems  Constitutional: Negative for fever and chills.  Cardiovascular: Negative for chest pain.  Gastrointestinal: Negative for nausea and vomiting.  Musculoskeletal: Positive for arthralgias (Left elbow pain). Negative for joint swelling, neck pain and neck stiffness.  Skin: Negative for color change and wound.  Neurological: Negative for dizziness, seizures, weakness and numbness.  All other systems reviewed and are negative.     Allergies  Penicillins  Home Medications   Prior to Admission medications   Medication Sig Start Date End Date Taking? Authorizing Provider  doxycycline (VIBRAMYCIN) 100 MG capsule Take 1 capsule (100 mg total) by mouth 2 (two) times daily. One po bid x 7 days 04/02/13   Benny Lennert, MD  HYDROcodone-acetaminophen (NORCO/VICODIN) 5-325 MG per tablet Take 1 tablet by  mouth every 6 (six) hours as needed for pain. 04/02/13   Benny Lennert, MD  ibuprofen (ADVIL,MOTRIN) 400 MG tablet Take 800 mg by mouth every 6 (six) hours as needed for pain.    Historical Provider, MD  promethazine (PHENERGAN) 25 MG tablet Take 1 tablet (25 mg total) by mouth every 6 (six) hours as needed for nausea. 04/02/13   Benny Lennert, MD   BP 144/85 mmHg  Pulse 86  Temp(Src) 98.3 F (36.8 C) (Oral)  Resp 20  Ht  (1.88 m)  Wt 261 lb 14.4 oz (118.797 kg)  BMI 33.61 kg/m2  SpO2 97% Physical Exam  Constitutional: He is oriented to person, place, and time. He appears well-developed and well-nourished. No distress.  HENT:  Head: Normocephalic and atraumatic.  Neck: Normal range of motion. Neck supple.  Cardiovascular: Normal rate, regular rhythm, normal heart sounds and intact distal pulses.   No murmur heard. Pulmonary/Chest: Effort normal and breath sounds normal. No respiratory distress. He exhibits no tenderness.  Musculoskeletal: He exhibits tenderness. He exhibits no edema.       Left elbow: He exhibits normal range of motion, no swelling, no effusion, no deformity and no laceration. Tenderness found.  Tenderness at the left distal bicep tendon. Tendon is palpable, no deformity of the bicep muscle. No erythema, excessive warmth or edema. Patient has full range of motion with pain reproduced on supination and flexion.  Lymphadenopathy:    He has no cervical adenopathy.  Neurological: He is alert and oriented to person, place,  and time. He exhibits normal muscle tone. Coordination normal.  Skin: Skin is warm and dry.    ED Course  Procedures (including critical care time) Labs Review Labs Reviewed - No data to display  Imaging Review Dg Elbow Complete Left  10/21/2014   CLINICAL DATA:  Left elbow pain and swelling post lifting, felt a pop  EXAM: LEFT ELBOW - COMPLETE 3+ VIEW  COMPARISON:  None.  FINDINGS: Four views of left elbow submitted. No acute fracture or  subluxation. No radiopaque foreign body. No posterior fat pad sign.  IMPRESSION: Negative.   Electronically Signed   By: Natasha MeadLiviu  Pop M.D.   On: 10/21/2014 20:20     EKG Interpretation None      MDM   Final diagnoses:  Elbow sprain, left, initial encounter   Patient is well-appearing. Vital signs are stable. Compartments of the left upper extremity are soft. Neurovascularly intact. No obvious bicep tendon rupture or clinical suspicion for septic joint.  Likely sprain, I have advised patient that he will need further orthopedic evaluation in 1 week if symptoms are not improving.  He agrees to plan and appear stable for discharge  Prescriptions written for naproxen and Percocet sling applied for comfort     Destiney Sanabia L. Trisha Mangleriplett, PA-C 10/21/14 2103  Benny LennertJoseph L Zammit, MD 10/23/14 [phone]

## 2014-10-31 ENCOUNTER — Encounter (HOSPITAL_COMMUNITY): Payer: Self-pay

## 2014-10-31 ENCOUNTER — Other Ambulatory Visit (HOSPITAL_COMMUNITY): Payer: Self-pay | Admitting: Orthopaedic Surgery

## 2014-10-31 ENCOUNTER — Ambulatory Visit (HOSPITAL_COMMUNITY)
Admission: RE | Admit: 2014-10-31 | Discharge: 2014-10-31 | Disposition: A | Discharge status: Home / Self Care | Payer: 59 | Source: Ambulatory Visit | Attending: Orthopaedic Surgery | Admitting: Orthopaedic Surgery

## 2014-10-31 DIAGNOSIS — S46212A Strain of muscle, fascia and tendon of other parts of biceps, left arm, initial encounter: Secondary | ICD-10-CM | POA: Insufficient documentation

## 2014-10-31 DIAGNOSIS — M79632 Pain in left forearm: Secondary | ICD-10-CM

## 2014-10-31 DIAGNOSIS — M25522 Pain in left elbow: Secondary | ICD-10-CM

## 2014-10-31 DIAGNOSIS — X58XXXA Exposure to other specified factors, initial encounter: Secondary | ICD-10-CM | POA: Insufficient documentation

## 2014-10-31 DIAGNOSIS — Y9389 Activity, other specified: Secondary | ICD-10-CM | POA: Insufficient documentation

## 2014-11-05 ENCOUNTER — Encounter (HOSPITAL_BASED_OUTPATIENT_CLINIC_OR_DEPARTMENT_OTHER): Payer: Self-pay | Admitting: *Deleted

## 2014-11-05 ENCOUNTER — Other Ambulatory Visit: Payer: Self-pay | Admitting: Orthopedic Surgery

## 2014-11-11 ENCOUNTER — Ambulatory Visit (HOSPITAL_BASED_OUTPATIENT_CLINIC_OR_DEPARTMENT_OTHER)
Admission: RE | Admit: 2014-11-11 | Discharge: 2014-11-11 | Disposition: A | Discharge status: Home / Self Care | Payer: 59 | Source: Ambulatory Visit | Attending: Orthopedic Surgery | Admitting: Orthopedic Surgery

## 2014-11-11 ENCOUNTER — Ambulatory Visit (HOSPITAL_BASED_OUTPATIENT_CLINIC_OR_DEPARTMENT_OTHER): Payer: 59 | Admitting: Anesthesiology

## 2014-11-11 ENCOUNTER — Encounter (HOSPITAL_BASED_OUTPATIENT_CLINIC_OR_DEPARTMENT_OTHER): Payer: Self-pay | Admitting: Orthopedic Surgery

## 2014-11-11 ENCOUNTER — Encounter (HOSPITAL_BASED_OUTPATIENT_CLINIC_OR_DEPARTMENT_OTHER)
Admission: RE | Disposition: A | Discharge status: Home / Self Care | Payer: Self-pay | Source: Ambulatory Visit | Attending: Orthopedic Surgery

## 2014-11-11 DIAGNOSIS — S46212A Strain of muscle, fascia and tendon of other parts of biceps, left arm, initial encounter: Secondary | ICD-10-CM | POA: Insufficient documentation

## 2014-11-11 DIAGNOSIS — Z8781 Personal history of (healed) traumatic fracture: Secondary | ICD-10-CM | POA: Diagnosis not present

## 2014-11-11 DIAGNOSIS — Y929 Unspecified place or not applicable: Secondary | ICD-10-CM | POA: Diagnosis not present

## 2014-11-11 DIAGNOSIS — Y999 Unspecified external cause status: Secondary | ICD-10-CM | POA: Diagnosis not present

## 2014-11-11 DIAGNOSIS — Y9389 Activity, other specified: Secondary | ICD-10-CM | POA: Insufficient documentation

## 2014-11-11 DIAGNOSIS — X58XXXA Exposure to other specified factors, initial encounter: Secondary | ICD-10-CM | POA: Insufficient documentation

## 2014-11-11 DIAGNOSIS — F17210 Nicotine dependence, cigarettes, uncomplicated: Secondary | ICD-10-CM | POA: Diagnosis not present

## 2014-11-11 HISTORY — DX: Nicotine dependence, unspecified, uncomplicated: F17.200

## 2014-11-11 HISTORY — DX: Strain of muscle, fascia and tendon of other parts of biceps, left arm, initial encounter: S46.212A

## 2014-11-11 HISTORY — PX: DISTAL BICEPS TENDON REPAIR: SHX1461

## 2014-11-11 SURGERY — REPAIR, TENDON, BICEPS, DISTAL
Anesthesia: General | Site: Elbow | Laterality: Left

## 2014-11-11 MED ORDER — VANCOMYCIN HCL IN DEXTROSE 500-5 MG/100ML-% IV SOLN
INTRAVENOUS | Status: AC
Start: 2014-11-11 — End: 2014-11-11
  Filled 2014-11-11: qty 100

## 2014-11-11 MED ORDER — OXYCODONE HCL 5 MG/5ML PO SOLN: 5.0000 mg | Freq: Once | ORAL | Status: AC | PRN

## 2014-11-11 MED ORDER — PROPOFOL 10 MG/ML IV BOLUS
INTRAVENOUS | Status: AC
  Filled 2014-11-11: qty 20

## 2014-11-11 MED ORDER — OXYCODONE HCL 5 MG PO TABS
5.0000 mg | ORAL_TABLET | Freq: Once | ORAL | Status: AC | PRN
  Administered 2014-11-11: 5 mg via ORAL

## 2014-11-11 MED ORDER — VANCOMYCIN HCL IN DEXTROSE 1-5 GM/200ML-% IV SOLN
INTRAVENOUS | Status: AC
  Filled 2014-11-11: qty 200

## 2014-11-11 MED ORDER — OXYCODONE-ACETAMINOPHEN 10-325 MG PO TABS: 1.0000 | ORAL_TABLET | ORAL | Status: AC | PRN

## 2014-11-11 MED ORDER — ONDANSETRON HCL 4 MG/2ML IJ SOLN: 4.0000 mg | Freq: Once | INTRAMUSCULAR | Status: DC | PRN

## 2014-11-11 MED ORDER — OXYCODONE HCL 5 MG PO TABS
ORAL_TABLET | ORAL | Status: AC
  Filled 2014-11-11: qty 1

## 2014-11-11 MED ORDER — FENTANYL CITRATE 0.05 MG/ML IJ SOLN
50.0000 ug | INTRAMUSCULAR | Status: DC | PRN
  Administered 2014-11-11: 100 ug via INTRAVENOUS

## 2014-11-11 MED ORDER — BUPIVACAINE-EPINEPHRINE (PF) 0.5% -1:200000 IJ SOLN
INTRAMUSCULAR | Status: DC | PRN
  Administered 2014-11-11: 25 mL via PERINEURAL

## 2014-11-11 MED ORDER — ONDANSETRON HCL 4 MG/2ML IJ SOLN
INTRAMUSCULAR | Status: DC | PRN
  Administered 2014-11-11: 4 mg via INTRAVENOUS

## 2014-11-11 MED ORDER — FENTANYL CITRATE 0.05 MG/ML IJ SOLN
INTRAMUSCULAR | Status: DC | PRN
Start: 2014-11-11 — End: 2014-11-11
  Administered 2014-11-11: 100 ug via INTRAVENOUS
  Administered 2014-11-11: 50 ug via INTRAVENOUS

## 2014-11-11 MED ORDER — LACTATED RINGERS IV SOLN
INTRAVENOUS | Status: DC
Start: 2014-11-11 — End: 2014-11-11
  Administered 2014-11-11: 13:00:00 via INTRAVENOUS

## 2014-11-11 MED ORDER — PROPOFOL 10 MG/ML IV BOLUS
INTRAVENOUS | Status: AC
  Filled 2014-11-11: qty 60

## 2014-11-11 MED ORDER — MIDAZOLAM HCL 2 MG/2ML IJ SOLN
INTRAMUSCULAR | Status: AC
  Filled 2014-11-11: qty 2

## 2014-11-11 MED ORDER — CHLORHEXIDINE GLUCONATE 4 % EX LIQD: 60.0000 mL | Freq: Once | CUTANEOUS | Status: DC

## 2014-11-11 MED ORDER — DEXAMETHASONE SODIUM PHOSPHATE 4 MG/ML IJ SOLN
INTRAMUSCULAR | Status: DC | PRN
  Administered 2014-11-11: 10 mg via INTRAVENOUS

## 2014-11-11 MED ORDER — FENTANYL CITRATE 0.05 MG/ML IJ SOLN
INTRAMUSCULAR | Status: AC
  Filled 2014-11-11: qty 4

## 2014-11-11 MED ORDER — MIDAZOLAM HCL 2 MG/2ML IJ SOLN
1.0000 mg | INTRAMUSCULAR | Status: DC | PRN
  Administered 2014-11-11: 2 mg via INTRAVENOUS

## 2014-11-11 MED ORDER — MIDAZOLAM HCL 5 MG/5ML IJ SOLN
INTRAMUSCULAR | Status: DC | PRN
  Administered 2014-11-11: 2 mg via INTRAVENOUS

## 2014-11-11 MED ORDER — FENTANYL CITRATE 0.05 MG/ML IJ SOLN
INTRAMUSCULAR | Status: AC
  Filled 2014-11-11: qty 2

## 2014-11-11 MED ORDER — MIDAZOLAM HCL 2 MG/2ML IJ SOLN
INTRAMUSCULAR | Status: AC
Start: 2014-11-11 — End: 2014-11-11
  Filled 2014-11-11: qty 2

## 2014-11-11 MED ORDER — VANCOMYCIN HCL 10 G IV SOLR: 1500.0000 mg | INTRAVENOUS | Status: DC

## 2014-11-11 MED ORDER — HYDROMORPHONE HCL 1 MG/ML IJ SOLN: 0.2500 mg | INTRAMUSCULAR | Status: DC | PRN

## 2014-11-11 MED ORDER — VANCOMYCIN HCL 10 G IV SOLR
1500.0000 mg | INTRAVENOUS | Status: AC
  Administered 2014-11-11: 1500 mg via INTRAVENOUS

## 2014-11-11 MED ORDER — LIDOCAINE HCL (CARDIAC) 20 MG/ML IV SOLN
INTRAVENOUS | Status: DC | PRN
  Administered 2014-11-11: 100 mg via INTRAVENOUS

## 2014-11-11 MED ORDER — PROPOFOL 10 MG/ML IV BOLUS
INTRAVENOUS | Status: DC | PRN
  Administered 2014-11-11: 300 mg via INTRAVENOUS

## 2014-11-11 SURGICAL SUPPLY — 76 items
ANCHOR BUTTON TIGHTROPE ABS (Orthopedic Implant) ×2 IMPLANT
BLADE MINI RND TIP GREEN BEAV (BLADE) IMPLANT
BLADE SURG 15 STRL LF DISP TIS (BLADE) ×1 IMPLANT
BLADE SURG 15 STRL SS (BLADE) ×3
BNDG CMPR 9X4 STRL LF SNTH (GAUZE/BANDAGES/DRESSINGS) ×1
BNDG COHESIVE 3X5 TAN STRL LF (GAUZE/BANDAGES/DRESSINGS) ×6 IMPLANT
BNDG ESMARK 4X9 LF (GAUZE/BANDAGES/DRESSINGS) ×3 IMPLANT
BNDG GAUZE ELAST 4 BULKY (GAUZE/BANDAGES/DRESSINGS) ×3 IMPLANT
CHLORAPREP W/TINT 26ML (MISCELLANEOUS) ×3 IMPLANT
CLIP TI MEDIUM 6 (CLIP) ×4 IMPLANT
CORDS BIPOLAR (ELECTRODE) ×3 IMPLANT
COVER BACK TABLE 60X90IN (DRAPES) ×3 IMPLANT
COVER MAYO STAND STRL (DRAPES) ×3 IMPLANT
CUFF TOURNIQUET SINGLE 18IN (TOURNIQUET CUFF) IMPLANT
CUFF TOURNIQUET SINGLE 24IN (TOURNIQUET CUFF) ×2 IMPLANT
DECANTER SPIKE VIAL GLASS SM (MISCELLANEOUS) IMPLANT
DRAPE EXTREMITY T 121X128X90 (DRAPE) ×3 IMPLANT
DRAPE OEC MINIVIEW 54X84 (DRAPES) ×3 IMPLANT
DRAPE SURG 17X23 STRL (DRAPES) ×2 IMPLANT
DRSG PAD ABDOMINAL 8X10 ST (GAUZE/BANDAGES/DRESSINGS) ×3 IMPLANT
GAUZE SPONGE 4X4 12PLY STRL (GAUZE/BANDAGES/DRESSINGS) ×3 IMPLANT
GAUZE SPONGE 4X4 16PLY XRAY LF (GAUZE/BANDAGES/DRESSINGS) IMPLANT
GAUZE XEROFORM 1X8 LF (GAUZE/BANDAGES/DRESSINGS) ×3 IMPLANT
GLOVE BIO SURGEON STRL SZ7.5 (GLOVE) ×2 IMPLANT
GLOVE BIO SURGEON STRL SZ8.5 (GLOVE) IMPLANT
GLOVE BIOGEL PI IND STRL 8 (GLOVE) IMPLANT
GLOVE BIOGEL PI IND STRL 8.5 (GLOVE) ×1 IMPLANT
GLOVE BIOGEL PI INDICATOR 8 (GLOVE) ×2
GLOVE BIOGEL PI INDICATOR 8.5 (GLOVE) ×2
GLOVE EXAM NITRILE EXT CUFF MD (GLOVE) ×2 IMPLANT
GLOVE SURG ORTHO 8.0 STRL STRW (GLOVE) ×3 IMPLANT
GLOVE SURG SS PI 7.0 STRL IVOR (GLOVE) ×2 IMPLANT
GOWN STRL REUS W/ TWL LRG LVL3 (GOWN DISPOSABLE) ×1 IMPLANT
GOWN STRL REUS W/TWL LRG LVL3 (GOWN DISPOSABLE) ×3
GOWN STRL REUS W/TWL XL LVL3 (GOWN DISPOSABLE) ×6 IMPLANT
INSERTER BUTTON (SYSTAGENIX WOUND MANAGEMENT) ×2 IMPLANT
LOOP VESSEL MAXI BLUE (MISCELLANEOUS) IMPLANT
NDL 1/2 CIR CATGUT .05X1.09 (NEEDLE) IMPLANT
NDL PRECISIONGLIDE 27X1.5 (NEEDLE) IMPLANT
NDL SUT 6 .5 CRC .975X.05 MAYO (NEEDLE) ×1 IMPLANT
NEEDLE 1/2 CIR CATGUT .05X1.09 (NEEDLE) IMPLANT
NEEDLE MAYO TAPER (NEEDLE) ×3
NEEDLE PRECISIONGLIDE 27X1.5 (NEEDLE) IMPLANT
NS IRRIG 1000ML POUR BTL (IV SOLUTION) ×3 IMPLANT
PACK BASIN DAY SURGERY FS (CUSTOM PROCEDURE TRAY) ×3 IMPLANT
PAD CAST 4YDX4 CTTN HI CHSV (CAST SUPPLIES) ×2 IMPLANT
PADDING CAST ABS 3INX4YD NS (CAST SUPPLIES) ×2
PADDING CAST ABS 4INX4YD NS (CAST SUPPLIES) ×2
PADDING CAST ABS COTTON 3X4 (CAST SUPPLIES) ×1 IMPLANT
PADDING CAST ABS COTTON 4X4 ST (CAST SUPPLIES) ×1 IMPLANT
PADDING CAST COTTON 4X4 STRL (CAST SUPPLIES) ×6
PIN DRILL ACL TIGHTROPE 4MM (PIN) ×2 IMPLANT
SLEEVE SCD COMPRESS KNEE MED (MISCELLANEOUS) ×3 IMPLANT
SLING ARM FOAM STRAP XLG (SOFTGOODS) ×2 IMPLANT
SPLINT PLASTER CAST XFAST 3X15 (CAST SUPPLIES) ×30 IMPLANT
SPLINT PLASTER XTRA FASTSET 3X (CAST SUPPLIES) ×60
SPONGE LAP 4X18 X RAY DECT (DISPOSABLE) ×1 IMPLANT
STOCKINETTE 4X48 STRL (DRAPES) ×3 IMPLANT
SUT 2 FIBERLOOP 20 STRT BLUE (SUTURE) ×3
SUT ETHIBOND 0 MO6 C/R (SUTURE) IMPLANT
SUT ETHILON 4 0 PC 5 (SUTURE) ×2 IMPLANT
SUT FIBERWIRE #2 38 T-5 BLUE (SUTURE)
SUT MERSILENE 3 0 FS 1 (SUTURE) IMPLANT
SUT SILK 2 0 SH (SUTURE) ×1 IMPLANT
SUT VIC AB 2-0 SH 27 (SUTURE) ×3
SUT VIC AB 2-0 SH 27XBRD (SUTURE) IMPLANT
SUT VIC AB 4-0 BRD 54 (SUTURE) IMPLANT
SUT VIC AB 4-0 P-3 18XBRD (SUTURE) IMPLANT
SUT VIC AB 4-0 P3 18 (SUTURE)
SUT VICRYL 4-0 PS2 18IN ABS (SUTURE) ×1 IMPLANT
SUTURE 2 FIBERLOOP 20 STRT BLU (SUTURE) ×1 IMPLANT
SUTURE FIBERWR #2 38 T-5 BLUE (SUTURE) IMPLANT
SYR BULB 3OZ (MISCELLANEOUS) ×3 IMPLANT
SYR CONTROL 10ML LL (SYRINGE) IMPLANT
TOWEL OR 17X24 6PK STRL BLUE (TOWEL DISPOSABLE) ×3 IMPLANT
UNDERPAD 30X30 INCONTINENT (UNDERPADS AND DIAPERS) ×1 IMPLANT

## 2014-11-11 NOTE — Anesthesia Postprocedure Evaluation (Signed)
  Anesthesia Post-op Note  Patient: Dylan Garza  Procedure(s) Performed: Procedure(s): RECONSTRUCTION OF DISTAL BICEPS TENDON LEFT ELBOW/PALMARIS LONGUS GRAFT (Left)  Patient Location: PACU  Anesthesia Type: General with regional block for post op pain   Level of Consciousness: awake, alert  and oriented  Airway and Oxygen Therapy: Patient Spontanous Breathing  Post-op Pain: none  Post-op Assessment: Post-op Vital signs reviewed  Post-op Vital Signs: Reviewed  Last Vitals:  Filed Vitals:   11/11/14 1530  BP: 133/82  Pulse: 86  Temp:   Resp: 15    Complications: No apparent anesthesia complications

## 2014-11-11 NOTE — Anesthesia Preprocedure Evaluation (Signed)
Anesthesia Evaluation  Patient identified by MRN, date of birth, ID band Patient awake    Reviewed: Allergy & Precautions, NPO status , Patient's Chart, lab work & pertinent test results  Airway Mallampati: I  TM Distance: >3 FB Neck ROM: Full    Dental  (+) Teeth Intact, Dental Advisory Given   Pulmonary Current Smoker,  breath sounds clear to auscultation        Cardiovascular Rhythm:Regular Rate:Normal     Neuro/Psych    GI/Hepatic   Endo/Other    Renal/GU      Musculoskeletal   Abdominal   Peds  Hematology   Anesthesia Other Findings   Reproductive/Obstetrics                             Anesthesia Physical Anesthesia Plan  ASA: I  Anesthesia Plan: General   Post-op Pain Management: MAC Combined w/ Regional for Post-op pain   Induction: Intravenous  Airway Management Planned: LMA  Additional Equipment:   Intra-op Plan:   Post-operative Plan: Extubation in OR  Informed Consent: I have reviewed the patients History and Physical, chart, labs and discussed the procedure including the risks, benefits and alternatives for the proposed anesthesia with the patient or authorized representative who has indicated his/her understanding and acceptance.   Dental advisory given  Plan Discussed with: CRNA, Anesthesiologist and Surgeon  Anesthesia Plan Comments:         Anesthesia Quick Evaluation

## 2014-11-11 NOTE — H&P (Signed)
Dylan Garza is a 34 year old right hand dominant male who on 10-21-14 was moving a bed when he felt a pop in his left elbow. This was in his home. He had immediate pain in the antecubital fossa. He went to [facility] where x-rays revealed no fracture. He was subsequently referred to Dr. Hilda Lias for follow up. He underwent MRI examination on 10-31-14 in that Dr. Hilda Lias felt that he had ruptured his biceps distally. MRI revealed that indeed this had occurred with a retraction of approximately 5 cm to the distal portion of the tendon. He has been referred by Dr. Hilda Lias for continued care. He has no prior history of injury to the elbow. He complains of intermittent moderate throbbing and burning type pain with a feeling of weakness. It has not changed. It occasionally awakens him from sleep. Activity makes it worse and rest makes it better. He has been taking Oxycodone and Naprosyn. He has not been wearing a brace. He complains of pain in the antecubital fossa left elbow.  PAST MEDICAL HISTORY:  He is allergic to PCN. He is not taking any medications. He has had no surgery.   FAMILY MEDICAL HISTORY: Positive for diabetes, heart disease, high BP and arthritis.  SOCIAL HISTORY:  He smokes 1 PPD and drinks socially. He is divorced. He works as a Estate agent.  REVIEW OF SYSTEMS: Negative for 14 points.  Dylan Garza is an 34 y.o. male.   Chief Complaint: rupture distal biceps tendon left HPI: see above  Past Medical History  Diagnosis Date  . Cervical spine fracture   . Rupture of left biceps tendon   . Current smoker     Past Surgical History  Procedure Laterality Date  . Tonsillectomy      History reviewed. No pertinent family history. Social History:  reports that he has been smoking Cigarettes.  He has been smoking about 1.00 pack per day. He does not have any smokeless tobacco history on file. He reports that he drinks alcohol. He reports that he does not use illicit  drugs.  Allergies:  Allergies  Allergen Reactions  . Penicillins     No prescriptions prior to admission    No results found for this or any previous visit (from the past 48 hour(s)).  No results found.   Pertinent items are noted in HPI.  Height  (1.88 m), weight 120.203 kg (265 lb).  General appearance: alert, cooperative and appears stated age Head: Normocephalic, without obvious abnormality Neck: no JVD Resp: clear to auscultation bilaterally Cardio: regular rate and rhythm, S1, S2 normal, no murmur, click, rub or gallop GI: soft, non-tender; bowel sounds normal; no masses,  no organomegaly Extremities: pain antcubital fossa left elbow Pulses: 2+ and symmetric Skin: Skin color, texture, turgor normal. No rashes or lesions Neurologic: Grossly normal Incision/Wound: na  Assessment/Plan X-rays reveal no fracture.  MRI reveals a rupture biceps tendon with retraction proximal.   We have discussed the injury with him. He is advised this can be treated surgically and that it does not have to be treated. He is advised he will have strength diminution of approximately 30% with some in both flexion and supination. He would like to have this repaired. Pre, peri and post op care are discussed along with risks and complications. Patient is aware there is no guarantee with surgery, possibility of infection, injury to arteries, nerves, and tendons, incomplete relief and dystrophy. He is advised of the possibility of re-rupture, possibility of grafting  with the palmaris longus. He is aware this will take 3 months to heal. He will be out of work during that time. He is scheduled for repair reconstruction biceps tendon left elbow, possible palmaris longus graft which is present.  Sally Reimers R 11/11/2014, 9:22 AM

## 2014-11-11 NOTE — Transfer of Care (Signed)
Immediate Anesthesia Transfer of Care Note  Patient: Dylan Garza  Procedure(s) Performed: Procedure(s): RECONSTRUCTION OF DISTAL BICEPS TENDON LEFT ELBOW/POSSIBLE PALMARIS LONGUS GRAFT (Left)  Patient Location: PACU  Anesthesia Type:GA combined with regional for post-op pain  Level of Consciousness: awake, alert  and patient cooperative  Airway & Oxygen Therapy: Patient Spontanous Breathing and Patient connected to face mask oxygen  Post-op Assessment: Report given to RN and Post -op Vital signs reviewed and stable  Post vital signs: Reviewed and stable  Last Vitals:  Filed Vitals:   11/11/14 1302  BP:   Pulse: 75  Temp:   Resp: 20    Complications: No apparent anesthesia complications

## 2014-11-11 NOTE — Brief Op Note (Signed)
11/11/2014  2:52 PM  PATIENT:  Dylan Garza  34 y.o. male  PRE-OPERATIVE DIAGNOSIS:  LEFT ELBOW DISTAL BICEPS RUPTURE  POST-OPERATIVE DIAGNOSIS:  LEFT ELBOW DISTAL BICEPS RUPTURE  PROCEDURE:  Procedure(s): RECONSTRUCTION OF DISTAL BICEPS TENDON LEFT ELBOW/POSSIBLE PALMARIS LONGUS GRAFT (Left)  SURGEON:  Surgeon(s) and Role:    * Cindee SaltGary Keynan Heffern, MD - Primary    * Betha LoaKevin Meko Masterson, MD - Assisting  PHYSICIAN ASSISTANT:   ASSISTANTS: K Tysheem Accardo,MD   ANESTHESIA:   regional and general  EBL:  Total I/O In: 200 [I.V.:200] Out: -   BLOOD ADMINISTERED:none  DRAINS: none   LOCAL MEDICATIONS USED:  NONE  SPECIMEN:  No Specimen  DISPOSITION OF SPECIMEN:  N/A  COUNTS:  YES  TOURNIQUET:   Total Tourniquet Time Documented: Upper Arm (Left) - 61 minutes Total: Upper Arm (Left) - 61 minutes   DICTATION: .Other Dictation: Dictation Number [id number]  PLAN OF CARE: Discharge to home after PACU  PATIENT DISPOSITION:  PACU - hemodynamically stable.

## 2014-11-11 NOTE — Discharge Instructions (Addendum)

## 2014-11-11 NOTE — Op Note (Signed)
Dictation Number [id number]

## 2014-11-11 NOTE — Progress Notes (Signed)
  Assisted Dr. Crews with left, ultrasound guided, supraclavicular block. Side rails up, monitors on throughout procedure. See vital signs in flow sheet. Tolerated Procedure well. 

## 2014-11-11 NOTE — Anesthesia Procedure Notes (Addendum)
Anesthesia Regional Block:  Supraclavicular block  Pre-Anesthetic Checklist: ,, timeout performed, Correct Patient, Correct Site, Correct Laterality, Correct Procedure, Correct Position, site marked, Risks and benefits discussed,  Surgical consent,  Pre-op evaluation,  At surgeon's request and post-op pain management  Laterality: Left and Upper  Prep: chloraprep       Needles:  Injection technique: Single-shot  Needle Type: Echogenic Stimulator Needle     Needle Length: 5cm 5 cm Needle Gauge: 21 and 21 G    Additional Needles:  Procedures: ultrasound guided (picture in chart) Supraclavicular block Narrative:  Start time: 11/11/2014 12:47 PM End time: 11/11/2014 12:50 PM Injection made incrementally with aspirations every 5 mL.  Performed by: Personally  Anesthesiologist: CREWS, DAVID A   Procedure Name: LMA Insertion Date/Time: 11/11/2014 1:34 PM Performed by: Gar GibbonKEETON, Jizelle Conkey S Pre-anesthesia Checklist: Patient identified, Emergency Drugs available, Suction available and Patient being monitored Patient Re-evaluated:Patient Re-evaluated prior to inductionOxygen Delivery Method: Circle System Utilized Preoxygenation: Pre-oxygenation with 100% oxygen Intubation Type: IV induction Ventilation: Mask ventilation without difficulty LMA: LMA inserted LMA Size: 4.0 Number of attempts: 1 Airway Equipment and Method: Bite block Placement Confirmation: positive ETCO2 Tube secured with: Tape Dental Injury: Teeth and Oropharynx as per pre-operative assessment

## 2014-11-12 ENCOUNTER — Encounter (HOSPITAL_BASED_OUTPATIENT_CLINIC_OR_DEPARTMENT_OTHER): Payer: Self-pay | Admitting: Orthopedic Surgery

## 2014-11-12 NOTE — Op Note (Signed)
[name]:  Garza, Dylan              ACCOUNT NO.:  [id number]  MEDICAL RECORD NO.:  [id number]  LOCATION:                                 FACILITY:  PHYSICIAN:  Cindee SaltGary Laporshia Hogen, M.D.       DATE OF BIRTH:  [birth year]  DATE OF PROCEDURE:  11/11/2014 DATE OF DISCHARGE:                              OPERATIVE REPORT   PREOPERATIVE DIAGNOSIS:  Rupture of biceps tendon distally, left elbow.  POSTOPERATIVE DIAGNOSIS:  Rupture of biceps tendon distally, left elbow.  OPERATION:  Repair of biceps, left elbow.  SURGEON:  Cindee SaltGary Nathanel Tallman, MD.  ANESTHESIA:  Supraclavicular block, general.  ANESTHESIOLOGIST:  Sheldon Silvanavid Crews, MD.  HISTORY:  The patient is a 34 year old male, who suffered a rupture of his biceps tendon moving a piece of furniture.  He was originally seen by Dr. Hilda LiasKeeling where MRI reveals a rupture of the biceps tendon, distal insertion.  He is admitted now for repair.  He is aware that there is no guarantee with the surgery, possibility of infection; recurrence of injury to arteries, nerves, tendons, incomplete relief of symptoms, dystrophy.  In the preoperative area, the patient is seen, the extremity marked by both patient and surgeon.  Antibiotic given.  He is aware that this is not an absolute necessity of repair, but he would like to proceed with it.  PROCEDURE IN DETAIL:  The patient was brought to the operating room, where a supraclavicular block was carried out in the preoperative area by Dr. Ivin Bootyrews.  General anesthetic was carried out in the operating room under the direction of Dr. Ivin Bootyrews.  He was prepped using ChloraPrep in supine position with the left arm free.  A 3-minute dry time was allowed.  Time-out taken, confirming the patient and procedure.  The limb was exsanguinated with an Esmarch bandage.  Tourniquet was placed high and the arm was inflated to 250 mmHg.  An incision transversely across the proximal forearm was made approximately 2 fingerbreadths distal to the elbow  crease with the  elbow in flexion, carried down through subcutaneous tissue.  A large bursal sac was immediately encountered with the stump of the biceps tendon directly under the skin. Neurovascular structures were identified and protected.  The dissection was then carried down following the scar tract to the biceps tuberosity taking care to protect neurovascular structures.  Communications between the cephalic basilic vein required clamping with hemoclips to allow visualization.  One small other vein deeply was hemoclipped.  No other veins were tied or ligated.  Arteries were left intact.  The biceps tuberosity was identified.  A long nasal speculum was placed around the radial tuberosity with the forearm in full supination.  This allowed excellent visualization of the entire tuberosity.  A drill hole was then placed for placement of a RetroButton.  The bone dust was removed.  The area irrigated.  A FiberWire loop was then used to create a Krackow or suture through the biceps tendon, which was debrided of any scarring. This was found to be able to be placed directly onto the tuberosity with the elbow in full extension.  This was placed deep to any blood vessels. The RetroButton was placed through 1  cortex without difficulty.  X-rays confirmed that they had engaged.  This allowed the biceps tendon to be sutured back down to the biceps tuberosity with the FiberLoop.  This was then sutured through the tendon itself proximal to the Krackow or suture where it exited from the distal portion of the biceps firmly fixing the biceps to the tuberosity.  The wound was again irrigated.  The subcutaneous tissue was then closed with interrupted 2-0 Vicryl sutures and the skin with interrupted 4-0 nylon sutures.  A sterile compressive dressing, posterior elbow splint in 90 degrees was applied.  On deflation of the tourniquet, all fingers were immediately pinked.  He was taken to the recovery room for  observation in satisfactory condition.  He will be discharged home to return to the [facility] in 1 week on Percocet.          ______________________________ Cindee Salt, M.D.     GK/MEDQ  D:  11/11/2014  T:  11/12/2014  Job:  [id number]

## 2018-05-04 ENCOUNTER — Encounter (HOSPITAL_COMMUNITY): Payer: Self-pay | Admitting: Emergency Medicine

## 2018-05-04 ENCOUNTER — Emergency Department (HOSPITAL_COMMUNITY)
Admission: EM | Admit: 2018-05-04 | Discharge: 2018-05-04 | Disposition: A | Discharge status: Home / Self Care | Payer: Self-pay | Attending: Emergency Medicine | Admitting: Emergency Medicine

## 2018-05-04 ENCOUNTER — Other Ambulatory Visit: Payer: Self-pay

## 2018-05-04 DIAGNOSIS — F17210 Nicotine dependence, cigarettes, uncomplicated: Secondary | ICD-10-CM | POA: Insufficient documentation

## 2018-05-04 DIAGNOSIS — M778 Other enthesopathies, not elsewhere classified: Secondary | ICD-10-CM | POA: Insufficient documentation

## 2018-05-04 DIAGNOSIS — Z79899 Other long term (current) drug therapy: Secondary | ICD-10-CM | POA: Insufficient documentation

## 2018-05-04 MED ORDER — KETOROLAC TROMETHAMINE 10 MG PO TABS
10.0000 mg | ORAL_TABLET | Freq: Once | ORAL | Status: AC
  Administered 2018-05-04: 10 mg via ORAL
  Filled 2018-05-04: qty 1

## 2018-05-04 MED ORDER — DICLOFENAC SODIUM 75 MG PO TBEC: 75.0000 mg | DELAYED_RELEASE_TABLET | Freq: Two times a day (BID) | ORAL | 0 refills | Status: AC

## 2018-05-04 MED ORDER — DEXAMETHASONE 4 MG PO TABS: 4.0000 mg | ORAL_TABLET | Freq: Two times a day (BID) | ORAL | 0 refills | Status: AC

## 2018-05-04 MED ORDER — ONDANSETRON HCL 4 MG PO TABS
4.0000 mg | ORAL_TABLET | Freq: Once | ORAL | Status: AC
  Administered 2018-05-04: 4 mg via ORAL
  Filled 2018-05-04: qty 1

## 2018-05-04 MED ORDER — PREDNISONE 50 MG PO TABS
50.0000 mg | ORAL_TABLET | Freq: Once | ORAL | Status: AC
  Administered 2018-05-04: 50 mg via ORAL
  Filled 2018-05-04: qty 1

## 2018-05-04 NOTE — ED Triage Notes (Signed)
Pt c/o of right wrist pain x 4 months.  Denies any injuries

## 2018-05-04 NOTE — Discharge Instructions (Addendum)
Please use the wrist splint daily for assistance with your tendinitis.  Please use Decadron 2 times daily.  Please use diclofenac 2 times daily with food.  You may also use Tylenol extra strength every 4 hours if needed for soreness or discomfort.  Please see Dr. Romeo AppleHarrison for orthopedic evaluation and management if not improving.

## 2018-05-04 NOTE — ED Provider Notes (Signed)
[facility] EMERGENCY DEPARTMENT Provider Note   CSN: [id number] Arrival date & time: 05/04/18  1028     History   Chief Complaint Chief Complaint  Patient presents with  . Wrist Pain    HPI Dylan Garza is a 37 y.o. male.  Patient is a 37 year old male who presents to the emergency department with a complaint of right wrist pain.  The patient states that he uses his hands a lot.  He now has pain with movement of the fingers and flexing of the wrist from the area just behind his thumb and his wrist extending to the midportion of the forearm.  He also has some tenderness to just general palpation, most of it comes with movement.  No recent injury to the wrist or hand.  Patient states he had a similar episode on the left side that was diagnosed as tendinitis.  He required required a injection in the area with steroids and he says it got a lot better.  The area has not been hot to touch, but painful with some movement.  No recent operations or procedures involving the hand or wrist.  The patient presents now for assistance with this issue.  The history is provided by the patient.    Past Medical History:  Diagnosis Date  . Cervical spine fracture (HCC)   . Current smoker   . Rupture of left biceps tendon     There are no active problems to display for this patient.   Past Surgical History:  Procedure Laterality Date  . DISTAL BICEPS TENDON REPAIR Left 11/11/2014   Procedure: RECONSTRUCTION OF DISTAL BICEPS TENDON LEFT ELBOW/PALMARIS LONGUS GRAFT;  Surgeon: Cindee SaltGary Kuzma, MD;  Location: [facility];  Service: Orthopedics;  Laterality: Left;  . TONSILLECTOMY          Home Medications    Prior to Admission medications   Medication Sig Start Date End Date Taking? Authorizing Provider  naproxen (NAPROSYN) 500 MG tablet Take 1 tablet (500 mg total) by mouth 2 (two) times daily with a meal. 10/21/14   Triplett, Tammy, PA-C  oxyCODONE-acetaminophen (PERCOCET) 10-325  MG per tablet Take 1 tablet by mouth every 4 (four) hours as needed for pain. 11/11/14   Cindee SaltKuzma, Gary, MD  oxyCODONE-acetaminophen (PERCOCET/ROXICET) 5-325 MG per tablet Take 1 tablet by mouth every 4 (four) hours as needed. 10/21/14   Triplett, Tammy, PA-C  promethazine (PHENERGAN) 25 MG tablet Take 1 tablet (25 mg total) by mouth every 6 (six) hours as needed for nausea. Patient not taking: Reported on 10/21/2014 04/02/13 10/21/14  Bethann BerkshireZammit, Joseph, MD    Family History History reviewed. No pertinent family history.  Social History Social History   Tobacco Use  . Smoking status: Current Every Day Smoker    Packs/day: 1.00    Types: Cigarettes  . Smokeless tobacco: Never Used  Substance Use Topics  . Alcohol use: Yes    Comment: social  . Drug use: No     Allergies   Penicillins   Review of Systems Review of Systems  Constitutional: Negative for activity change.       All ROS Neg except as noted in HPI  HENT: Negative for nosebleeds.   Eyes: Negative for photophobia and discharge.  Respiratory: Negative for cough, shortness of breath and wheezing.   Cardiovascular: Negative for chest pain and palpitations.  Gastrointestinal: Negative for abdominal pain and blood in stool.  Genitourinary: Negative for dysuria, frequency and hematuria.  Musculoskeletal: Positive for arthralgias.  Negative for back pain and neck pain.  Skin: Negative.   Neurological: Negative for dizziness, seizures and speech difficulty.  Psychiatric/Behavioral: Negative for confusion and hallucinations.     Physical Exam Updated Vital Signs BP (!) 148/93   Pulse 70   Temp 98.1 F (36.7 C)   Ht 6\' 2"  (1.88 m)   Wt 113.4 kg (250 lb)   SpO2 100%   BMI 32.10 kg/m   Physical Exam  Constitutional: He is oriented to person, place, and time. He appears well-developed and well-nourished.  Non-toxic appearance.  HENT:  Head: Normocephalic.  Right Ear: Tympanic membrane and external ear normal.  Left Ear:  Tympanic membrane and external ear normal.  Eyes: Pupils are equal, round, and reactive to light. EOM and lids are normal.  Neck: Normal range of motion. Neck supple. Carotid bruit is not present.  Cardiovascular: Normal rate, regular rhythm, normal heart sounds, intact distal pulses and normal pulses.  Pulmonary/Chest: Breath sounds normal. No respiratory distress.  Abdominal: Soft. Bowel sounds are normal. There is no tenderness. There is no guarding.  Musculoskeletal:       Right wrist: He exhibits decreased range of motion and tenderness. He exhibits no deformity.  No pain or deformity of and anatomical snuff box on the right. There is pain with flexion and extension of the right wrist.  The area is not hot.  There is pain with flexion extension of the thumb.  Capillary refill is less than 2 seconds.  Radial pulses are 2+.  There is full range of motion of the right elbow and shoulder.  Lymphadenopathy:       Head (right side): No submandibular adenopathy present.       Head (left side): No submandibular adenopathy present.    He has no cervical adenopathy.  Neurological: He is alert and oriented to person, place, and time. He has normal strength. No cranial nerve deficit or sensory deficit.  Skin: Skin is warm and dry.  Psychiatric: He has a normal mood and affect. His speech is normal.  Nursing note and vitals reviewed.    ED Treatments / Results  Labs (all labs ordered are listed, but only abnormal results are displayed) Labs Reviewed - No data to display  EKG None  Radiology No results found.  Procedures Procedures (including critical care time)  Medications Ordered in ED Medications  predniSONE (DELTASONE) tablet 50 mg (has no administration in time range)  ondansetron (ZOFRAN) tablet 4 mg (has no administration in time range)  ketorolac (TORADOL) tablet 10 mg (has no administration in time range)     Initial Impression / Assessment and Plan / ED Course  I have  reviewed the triage vital signs and the nursing notes.  Pertinent labs & imaging results that were available during my care of the patient were reviewed by me and considered in my medical decision making (see chart for details).       Final Clinical Impressions(s) / ED Diagnoses MDM  No recent operations or procedures involving the wrist.  No direct trauma.  Patient states he uses his arms and hands a lot.  The examination favors a tendinitis.  The patient has no neurovascular deficits appreciated on examination.  Patient is fitted with a splint for the wrist.  Prescription for steroid and anti-inflammatory pain medication given.  The patient will follow up with Dr. Romeo Apple for additional evaluation if not improving.   Final diagnoses:  Right wrist tendonitis    ED Discharge Orders  Ordered    dexamethasone (DECADRON) 4 MG tablet  2 times daily with meals     05/04/18 1109    diclofenac (VOLTAREN) 75 MG EC tablet  2 times daily     05/04/18 1109       Ivery Quale, PA-C 05/04/18 1134    Raeford Razor, MD 05/04/18 1734

## 2022-01-25 ENCOUNTER — Ambulatory Visit
Admission: EM | Admit: 2022-01-25 | Discharge: 2022-01-25 | Disposition: A | Discharge status: Home / Self Care | Payer: BC Managed Care – PPO

## 2022-01-25 ENCOUNTER — Ambulatory Visit (INDEPENDENT_AMBULATORY_CARE_PROVIDER_SITE_OTHER): Payer: BC Managed Care – PPO

## 2022-01-25 DIAGNOSIS — R0782 Intercostal pain: Secondary | ICD-10-CM

## 2022-01-25 DIAGNOSIS — M940 Chondrocostal junction syndrome [Tietze]: Secondary | ICD-10-CM | POA: Diagnosis not present

## 2022-01-25 MED ORDER — PREDNISONE 10 MG (21) PO TBPK: ORAL_TABLET | Freq: Every day | ORAL | 0 refills | Status: AC

## 2022-01-25 NOTE — ED Triage Notes (Signed)
Pt reports left sided rib cage pain x 1 1/2 week.  Pain is worse when laughing. Ibuprofen gives no relief.  ?

## 2022-01-25 NOTE — Discharge Instructions (Addendum)
Your x-ray is negative for fracture of your ribs.  Symptoms are consistent with costochondritis based on your examination. ?Take medication as prescribed.  Take medication with breakfast. ?Apply warm compresses to the affected area as needed for pain or discomfort.  Apply for 20 minutes, remove for 1 hour, then repeat as needed. ?Avoid any strenuous activity while symptoms persist. ?Recommend gentle stretching and range of motion exercises to help expedite recovery. ?Follow-up in the emergency department if you develop a sudden onset of shortness of breath, difficulty breathing, or trouble breathing. ?Follow-up in our clinic if symptoms do not improve. ?

## 2022-01-25 NOTE — ED Provider Notes (Signed)
?[facility] ? ? ? ?CSN: [id number] ?Arrival date & time: 01/25/22  1647 ? ? ?  ? ?History   ?Chief Complaint ?Chief Complaint  ?Patient presents with  ? Rib pain  ? ? ?HPI ?Dylan Garza is a 41 y.o. male.  ? ?Patient is a 41 year old male who presents with left-sided rib pain.  Patient's symptoms started approximately 1-1/2 weeks ago.  Patient states over the past 24 to 48 hours his symptoms have worsened.  He reports pain is increased with laughing, deep breathing, sneezing, coughing, and with certain movements.  He reports the pain starts under the left breast and radiates into the left side of his back.  He states that he has been coughing more so with his recent allergies.  He denies any injury or trauma, shortness of breath, difficulty breathing, or trouble breathing.  Patient states he has been taking ibuprofen for his symptoms with minimal relief. ? ?The history is provided by the patient.  ? ?Past Medical History:  ?Diagnosis Date  ? Cervical spine fracture (HCC)   ? Current smoker   ? Rupture of left biceps tendon   ? ? ?There are no problems to display for this patient. ? ? ?Past Surgical History:  ?Procedure Laterality Date  ? DISTAL BICEPS TENDON REPAIR Left 11/11/2014  ? Procedure: RECONSTRUCTION OF DISTAL BICEPS TENDON LEFT ELBOW/PALMARIS LONGUS GRAFT;  Surgeon: Cindee Salt, MD;  Location: [facility];  Service: Orthopedics;  Laterality: Left;  ? TONSILLECTOMY    ? ? ? ? ? ?Home Medications   ? ?Prior to Admission medications   ?Medication Sig Start Date End Date Taking? Authorizing Provider  ?ibuprofen (ADVIL) 400 MG tablet Take 400 mg by mouth every 6 (six) hours as needed.   Yes [provider]  ?dexamethasone (DECADRON) 4 MG tablet Take 1 tablet (4 mg total) by mouth 2 (two) times daily with a meal. 05/04/18   Ivery Quale, PA-C  ?diclofenac (VOLTAREN) 75 MG EC tablet Take 1 tablet (75 mg total) by mouth 2 (two) times daily. 05/04/18   Ivery Quale, PA-C   ?naproxen (NAPROSYN) 500 MG tablet Take 1 tablet (500 mg total) by mouth 2 (two) times daily with a meal. 10/21/14   Triplett, Tammy, PA-C  ?oxyCODONE-acetaminophen (PERCOCET) 10-325 MG per tablet Take 1 tablet by mouth every 4 (four) hours as needed for pain. 11/11/14   Cindee Salt, MD  ?oxyCODONE-acetaminophen (PERCOCET/ROXICET) 5-325 MG per tablet Take 1 tablet by mouth every 4 (four) hours as needed. 10/21/14   Triplett, Tammy, PA-C  ? ? ?Family History ?History reviewed. No pertinent family history. ? ?Social History ?Social History  ? ?Tobacco Use  ? Smoking status: Every Day  ?  Packs/day: 1.00  ?  Types: Cigarettes  ? Smokeless tobacco: Never  ?Substance Use Topics  ? Alcohol use: Yes  ?  Comment: social  ? Drug use: No  ? ? ? ?Allergies   ?Penicillins ? ? ?Review of Systems ?Review of Systems  ?Constitutional: Negative.   ?Respiratory:  Negative for cough, shortness of breath and wheezing.   ?Cardiovascular:  Positive for chest pain (left-sided chest pain).  ?Gastrointestinal: Negative.   ?Skin: Negative.   ?Psychiatric/Behavioral: Negative.    ? ? ?Physical Exam ?Triage Vital Signs ?ED Triage Vitals  ?Enc Vitals Group  ?   BP 01/25/22 1818 (!) 143/89  ?   Pulse Rate 01/25/22 1818 71  ?   Resp 01/25/22 1818 18  ?   Temp 01/25/22  1818 97.9 ?F (36.6 ?C)  ?   Temp Source 01/25/22 1818 Oral  ?   SpO2 01/25/22 1818 96 %  ?   Weight --   ?   Height --   ?   Head Circumference --   ?   Peak Flow --   ?   Pain Score 01/25/22 1821 6  ?   Pain Loc --   ?   Pain Edu? --   ?   Excl. in GC? --   ? ?No data found. ? ?Updated Vital Signs ?BP (!) 143/89 (BP Location: Right Arm)   Pulse 71   Temp 97.9 ?F (36.6 ?C) (Oral)   Resp 18   SpO2 96%  ? ?Visual Acuity ?Right Eye Distance:   ?Left Eye Distance:   ?Bilateral Distance:   ? ?Right Eye Near:   ?Left Eye Near:    ?Bilateral Near:    ? ?Physical Exam ?Vitals reviewed.  ?Constitutional:   ?   Appearance: Normal appearance.  ?HENT:  ?   Head: Normocephalic.  ?Eyes:  ?    Extraocular Movements: Extraocular movements intact.  ?   Conjunctiva/sclera: Conjunctivae normal.  ?Cardiovascular:  ?   Rate and Rhythm: Normal rate and regular rhythm.  ?   Pulses: Normal pulses.  ?   Heart sounds: Normal heart sounds.  ?Pulmonary:  ?   Effort: Pulmonary effort is normal.  ?   Breath sounds: Normal breath sounds.  ?Chest:  ?   Chest wall: Tenderness present. No deformity, swelling or edema.  ? ? ?   Comments: Tenderness to palpation under the left breast.  Patient has tenderness that radiates into the left thoracic paraspinal muscles.  Pain is reproducible with deep palpation. ?Abdominal:  ?   General: Bowel sounds are normal.  ?   Palpations: Abdomen is soft.  ?Musculoskeletal:  ?   Cervical back: Normal range of motion and neck supple.  ?Skin: ?   General: Skin is warm and dry.  ?   Capillary Refill: Capillary refill takes less than 2 seconds.  ?Neurological:  ?   General: No focal deficit present.  ?   Mental Status: He is alert and oriented to person, place, and time.  ?Psychiatric:     ?   Mood and Affect: Mood normal.     ?   Behavior: Behavior normal.  ? ? ? ?UC Treatments / Results  ?Labs ?(all labs ordered are listed, but only abnormal results are displayed) ?Labs Reviewed - No data to display ? ?EKG ? ? ?Radiology ?DG Ribs Unilateral W/Chest Left ? ?Result Date: 01/25/2022 ?CLINICAL DATA:  Left-sided rib pain for 1-1/2 weeks. No known injury. EXAM: LEFT RIBS AND CHEST - 3+ VIEW COMPARISON:  Chest radiograph 04/02/2013 FINDINGS: No fracture or other bone lesions are seen involving the ribs. There is no evidence of pneumothorax or pleural effusion. Both lungs are clear. Heart size and mediastinal contours are within normal limits. IMPRESSION: Negative radiographs of the chest and left ribs. Electronically Signed   By: Narda RutherfordMelanie  Sanford M.D.   On: 01/25/2022 18:47   ? ?Procedures ?Procedures (including critical care time) ? ?Medications Ordered in UC ?Medications - No data to  display ? ?Initial Impression / Assessment and Plan / UC Course  ?I have reviewed the triage vital signs and the nursing notes. ? ?Pertinent labs & imaging results that were available during my care of the patient were reviewed by me and considered in my medical decision making (see  chart for details). ? ?The patient is a 41 year old male dents for left-sided rib pain.  Symptoms started approximately 1 week ago.  Symptoms worsened over the past 24 to 48 hours.  Patient denies any injury or trauma.  States that he has been coughing more recently due to allergies.  On exam, he has moderate tenderness to the left chest wall immediately under the left breast.  He states pain radiates into the left thoracic paraspinal region.  Pain is reproducible with palpation.  Chest x-ray is negative for rib fracture or pneumothorax.  Symptoms are consistent with costochondritis symptoms are worsened with movement, deep breathing, laughing, and coughing.  It is also reproducible.  Discussion with patient that we cannot also rule out a thoracic myofascial strain.  We will treat patient with a prednisone taper.  And provided.  Strict precautions to go to the ED were provided to the patient.  Patient advised to follow-up if symptoms do not improve. ?Final Clinical Impressions(s) / UC Diagnoses  ? ?Final diagnoses:  ?Dylan Garza  ? ?Discharge Instructions   ?Dylan Garza ?  ? ?ED Prescriptions   ?Dylan Garza ?  ? ?PDMP not reviewed this encounter. ?  ?Abran Cantor, NP ?01/25/22 1900 ? ?

## 2024-06-05 ENCOUNTER — Ambulatory Visit
Admission: EM | Admit: 2024-06-05 | Discharge: 2024-06-05 | Disposition: A | Discharge status: Home / Self Care | Attending: Nurse Practitioner | Admitting: Nurse Practitioner

## 2024-06-05 DIAGNOSIS — I776 Arteritis, unspecified: Secondary | ICD-10-CM | POA: Diagnosis not present

## 2024-06-05 MED ORDER — DOXYCYCLINE HYCLATE 100 MG PO TABS: 100.0000 mg | ORAL_TABLET | Freq: Two times a day (BID) | ORAL | 0 refills | Status: AC

## 2024-06-05 NOTE — Discharge Instructions (Signed)
 Lab work is pending.  You will be contacted if the pending test results are abnormal.  You will also have access to your results via MyChart. Take medication as prescribed. You may take over-the-counter Tylenol  as needed for pain, fever, or general discomfort. Elevate the lower extremities is much as possible for the next 24 to 48 hours. Monitor your symptoms for worsening.  If symptoms do not improve over the next 24 to 48 hours, or fail to worsen, you you will need to follow-up in this clinic or in the emergency department for further evaluation. Recommend follow-up with your PCP within the next 7 to 10 days for reevaluation. Follow-up as needed.

## 2024-06-05 NOTE — ED Provider Notes (Signed)
 [facility]    CSN: [id number] Arrival date & time: 06/05/24  1141      History   Chief Complaint No chief complaint on file.   HPI Dylan Garza is a 43 y.o. male.   The history is provided by the patient.   Patient presents for complaints of a rash to the bilateral legs that started this morning.  Patient states that the symptoms were there when he woke up earlier.  He denies exposure to new soaps, medications, lotions, detergents, or plants.  He states that the lower extremities are sore.  He denies itching.  Patient states that several days prior, he has had bodyaches and a headache.  Past Medical History:  Diagnosis Date   Cervical spine fracture (HCC)    Current smoker    Rupture of left biceps tendon     There are no active problems to display for this patient.   Past Surgical History:  Procedure Laterality Date   DISTAL BICEPS TENDON REPAIR Left 11/11/2014   Procedure: RECONSTRUCTION OF DISTAL BICEPS TENDON LEFT ELBOW/PALMARIS LONGUS GRAFT;  Surgeon: Arley Curia, MD;  Location: [facility];  Service: Orthopedics;  Laterality: Left;   TONSILLECTOMY         Home Medications    Prior to Admission medications   Medication Sig Start Date End Date Taking? Authorizing Provider  dexamethasone  (DECADRON ) 4 MG tablet Take 1 tablet (4 mg total) by mouth 2 (two) times daily with a meal. 05/04/18   Armida Culver, PA-C  diclofenac  (VOLTAREN ) 75 MG EC tablet Take 1 tablet (75 mg total) by mouth 2 (two) times daily. 05/04/18   Armida Culver, PA-C  ibuprofen  (ADVIL ) 400 MG tablet Take 400 mg by mouth every 6 (six) hours as needed.    [provider]  naproxen  (NAPROSYN ) 500 MG tablet Take 1 tablet (500 mg total) by mouth 2 (two) times daily with a meal. 10/21/14   Triplett, Tammy, PA-C  oxyCODONE -acetaminophen  (PERCOCET) 10-325 MG per tablet Take 1 tablet by mouth every 4 (four) hours as needed for pain. 11/11/14   Curia Arley, MD   oxyCODONE -acetaminophen  (PERCOCET/ROXICET) 5-325 MG per tablet Take 1 tablet by mouth every 4 (four) hours as needed. 10/21/14   Herlinda Milling, PA-C    Family History History reviewed. No pertinent family history.  Social History Social History   Tobacco Use   Smoking status: Every Day    Current packs/day: 1.00    Types: Cigarettes   Smokeless tobacco: Never  Substance Use Topics   Alcohol use: Yes    Comment: social   Drug use: No     Allergies   Penicillins   Review of Systems Review of Systems Per HPI  Physical Exam Triage Vital Signs ED Triage Vitals  Encounter Vitals Group     BP 06/05/24 1250 (!) 147/89     Girls Systolic BP Percentile --      Girls Diastolic BP Percentile --      Boys Systolic BP Percentile --      Boys Diastolic BP Percentile --      Pulse Rate 06/05/24 1250 79     Resp 06/05/24 1250 18     Temp 06/05/24 1250 98.7 F (37.1 C)     Temp Source 06/05/24 1250 Oral     SpO2 06/05/24 1250 94 %     Weight --      Height --      Head Circumference --  Peak Flow --      Pain Score 06/05/24 1254 0     Pain Loc --      Pain Education --      Exclude from Growth Chart --    No data found.  Updated Vital Signs BP (!) 147/89 (BP Location: Right Arm)   Pulse 79   Temp 98.7 F (37.1 C) (Oral)   Resp 18   SpO2 94%   Visual Acuity Right Eye Distance:   Left Eye Distance:   Bilateral Distance:    Right Eye Near:   Left Eye Near:    Bilateral Near:     Physical Exam Vitals and nursing note reviewed.  Constitutional:      General: He is not in acute distress.    Appearance: Normal appearance.  HENT:     Head: Normocephalic.  Eyes:     Extraocular Movements: Extraocular movements intact.     Pupils: Pupils are equal, round, and reactive to light.  Cardiovascular:     Rate and Rhythm: Normal rate and regular rhythm.     Pulses: Normal pulses.     Heart sounds: Normal heart sounds.  Pulmonary:     Effort: Pulmonary effort  is normal. No respiratory distress.     Breath sounds: Normal breath sounds. No stridor. No wheezing, rhonchi or rales.  Abdominal:     General: Bowel sounds are normal.     Palpations: Abdomen is soft.  Musculoskeletal:     Cervical back: Normal range of motion.  Skin:    General: Skin is warm and dry.     Findings: Petechiae and rash present.     Comments: Petechial rash noted to the bilateral lower extremities.  Rash is greater in the right lower extremity, with discoloration noted around the medial aspect of the lower leg and ankle.  See attached images.  Neurological:     General: No focal deficit present.     Mental Status: He is alert and oriented to person, place, and time.  Psychiatric:        Mood and Affect: Mood normal.        Behavior: Behavior normal.               UC Treatments / Results  Labs (all labs ordered are listed, but only abnormal results are displayed) Labs Reviewed - No data to display  EKG   Radiology No results found.  Procedures Procedures (including critical care time)  Medications Ordered in UC Medications - No data to display  Initial Impression / Assessment and Plan / UC Course  I have reviewed the triage vital signs and the nursing notes.  Pertinent labs & imaging results that were available during my care of the patient were reviewed by me and considered in my medical decision making (see chart for details).  Discussion with supervising MD, Dr. Rolinda, images were provided.  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  Will collect CBC, CMP, recommend spotted fever and Lyme disease test.  Will start patient on doxycycline  to cover for possible RMSF.  Supportive care recommendations were provided and discussed with the patient to include fluids, rest, and over-the-counter analgesics.  Discussed strict follow-up precautions with patient, advised patient he will need to follow-up in this clinic if symptoms worsen over the next 48  hours, otherwise follow-up with his PCP for reevaluation within the next week.  Patient was in agreement with this plan of care and verbalizes understanding.  All questions were answered.  Patient stable for discharge.  Work note was provided.  Final Clinical Impressions(s) / UC Diagnoses   Final diagnoses:  None   Discharge Instructions   None    ED Prescriptions   None    PDMP not reviewed this encounter.   Gilmer Etta PARAS, NP 06/05/24 1341

## 2024-06-05 NOTE — ED Triage Notes (Signed)
 Pt reports rash on both legs, swelling present, hot to the touch, started this morning. Denies any changed to daily routine.

## 2024-06-06 ENCOUNTER — Ambulatory Visit (HOSPITAL_COMMUNITY): Payer: Self-pay

## 2024-06-06 LAB — CBC WITH DIFFERENTIAL/PLATELET
Basophils Absolute: 0.1 x10E3/uL (ref 0.0–0.2)
Basos: 1 %
EOS (ABSOLUTE): 0.1 x10E3/uL (ref 0.0–0.4)
Eos: 2 %
Hematocrit: 49.2 % (ref 37.5–51.0)
Hemoglobin: 16.8 g/dL (ref 13.0–17.7)
Immature Grans (Abs): 0.2 x10E3/uL — ABNORMAL HIGH (ref 0.0–0.1)
Immature Granulocytes: 2 %
Lymphocytes Absolute: 1.1 x10E3/uL (ref 0.7–3.1)
Lymphs: 14 %
MCH: 31.8 pg (ref 26.6–33.0)
MCHC: 34.1 g/dL (ref 31.5–35.7)
MCV: 93 fL (ref 79–97)
Monocytes Absolute: 0.7 x10E3/uL (ref 0.1–0.9)
Monocytes: 9 %
Neutrophils Absolute: 5.6 x10E3/uL (ref 1.4–7.0)
Neutrophils: 72 %
Platelets: 309 x10E3/uL (ref 150–450)
RBC: 5.29 x10E6/uL (ref 4.14–5.80)
RDW: 12.4 % (ref 11.6–15.4)
WBC: 7.7 x10E3/uL (ref 3.4–10.8)

## 2024-06-06 LAB — COMPREHENSIVE METABOLIC PANEL WITH GFR
ALT: 42 IU/L (ref 0–44)
AST: 24 IU/L (ref 0–40)
Albumin: 4.3 g/dL (ref 4.1–5.1)
Alkaline Phosphatase: 114 IU/L (ref 44–121)
BUN/Creatinine Ratio: 10 (ref 9–20)
BUN: 12 mg/dL (ref 6–24)
Bilirubin Total: 0.5 mg/dL (ref 0.0–1.2)
CO2: 19 mmol/L — ABNORMAL LOW (ref 20–29)
Calcium: 9.2 mg/dL (ref 8.7–10.2)
Chloride: 102 mmol/L (ref 96–106)
Creatinine, Ser: 1.17 mg/dL (ref 0.76–1.27)
Globulin, Total: 2.2 g/dL (ref 1.5–4.5)
Glucose: 151 mg/dL — ABNORMAL HIGH (ref 70–99)
Potassium: 4 mmol/L (ref 3.5–5.2)
Sodium: 138 mmol/L (ref 134–144)
Total Protein: 6.5 g/dL (ref 6.0–8.5)
eGFR: 78 mL/min/1.73 (ref >59)

## 2024-06-06 LAB — LYME DISEASE SEROLOGY W/REFLEX: Lyme Total Antibody EIA: NEGATIVE

## 2024-06-06 LAB — SPOTTED FEVER GROUP ANTIBODIES
Spotted Fever Group IgG: <1:64 {titer}
Spotted Fever Group IgM: <1:64 {titer}

## 2024-06-07 ENCOUNTER — Ambulatory Visit
Admission: EM | Admit: 2024-06-07 | Discharge: 2024-06-07 | Disposition: A | Discharge status: Home / Self Care | Attending: Family Medicine | Admitting: Family Medicine

## 2024-06-07 DIAGNOSIS — R21 Rash and other nonspecific skin eruption: Secondary | ICD-10-CM | POA: Diagnosis not present

## 2024-06-07 DIAGNOSIS — M79641 Pain in right hand: Secondary | ICD-10-CM | POA: Diagnosis not present

## 2024-06-07 DIAGNOSIS — M79642 Pain in left hand: Secondary | ICD-10-CM | POA: Diagnosis not present

## 2024-06-07 MED ORDER — PREDNISONE 20 MG PO TABS: 40.0000 mg | ORAL_TABLET | Freq: Every day | ORAL | 0 refills | Status: AC

## 2024-06-07 NOTE — ED Provider Notes (Signed)
 [facility]    CSN: [id number] Arrival date & time: 06/07/24  1123      History   Chief Complaint No chief complaint on file.   HPI Dylan Garza is a 43 y.o. male.   Patient presenting today following up on urgent care visit 2 days ago for a vascular appearing rash to the lower legs.  He states the rash is unchanged, took 2 doses of doxycycline  so far but hours after taking the first dose began having bilateral hand pain, swelling, cramping, numbness, tingling.  Did take 1 more dose after symptoms started but then stopped taking it and has noted that the pain has slowly improved.  Denies discoloration to the hands, weakness, loss of range of motion or any other new symptoms associated with.  Trying Tylenol  and ibuprofen  with minimal relief.    Past Medical History:  Diagnosis Date   Cervical spine fracture (HCC)    Current smoker    Rupture of left biceps tendon     There are no active problems to display for this patient.   Past Surgical History:  Procedure Laterality Date   DISTAL BICEPS TENDON REPAIR Left 11/11/2014   Procedure: RECONSTRUCTION OF DISTAL BICEPS TENDON LEFT ELBOW/PALMARIS LONGUS GRAFT;  Surgeon: Arley Curia, MD;  Location: [facility];  Service: Orthopedics;  Laterality: Left;   TONSILLECTOMY         Home Medications    Prior to Admission medications   Medication Sig Start Date End Date Taking? Authorizing Provider  predniSONE  (DELTASONE ) 20 MG tablet Take 2 tablets (40 mg total) by mouth daily with breakfast. 06/07/24  Yes Stuart Vernell Norris, PA-C  dexamethasone  (DECADRON ) 4 MG tablet Take 1 tablet (4 mg total) by mouth 2 (two) times daily with a meal. 05/04/18   Armida Culver, PA-C  diclofenac  (VOLTAREN ) 75 MG EC tablet Take 1 tablet (75 mg total) by mouth 2 (two) times daily. 05/04/18   Armida Culver, PA-C  doxycycline  (VIBRA -TABS) 100 MG tablet Take 1 tablet (100 mg total) by mouth 2 (two) times daily for 10 days. 06/05/24  06/15/24  Leath-Warren, Etta PARAS, NP  ibuprofen  (ADVIL ) 400 MG tablet Take 400 mg by mouth every 6 (six) hours as needed.    [provider]  naproxen  (NAPROSYN ) 500 MG tablet Take 1 tablet (500 mg total) by mouth 2 (two) times daily with a meal. 10/21/14   Triplett, Tammy, PA-C  oxyCODONE -acetaminophen  (PERCOCET) 10-325 MG per tablet Take 1 tablet by mouth every 4 (four) hours as needed for pain. 11/11/14   Curia Arley, MD  oxyCODONE -acetaminophen  (PERCOCET/ROXICET) 5-325 MG per tablet Take 1 tablet by mouth every 4 (four) hours as needed. 10/21/14   Herlinda Milling, PA-C    Family History History reviewed. No pertinent family history.  Social History Social History   Tobacco Use   Smoking status: Every Day    Current packs/day: 1.00    Types: Cigarettes   Smokeless tobacco: Never  Substance Use Topics   Alcohol use: Yes    Comment: social   Drug use: No     Allergies   Penicillins   Review of Systems Review of Systems PER HPI  Physical Exam Triage Vital Signs ED Triage Vitals  Encounter Vitals Group     BP 06/07/24 1243 (!) 138/92     Girls Systolic BP Percentile --      Girls Diastolic BP Percentile --      Boys Systolic BP Percentile --  Boys Diastolic BP Percentile --      Pulse Rate 06/07/24 1243 68     Resp 06/07/24 1243 20     Temp 06/07/24 1243 98.1 F (36.7 C)     Temp Source 06/07/24 1243 Oral     SpO2 06/07/24 1243 94 %     Weight --      Height --      Head Circumference --      Peak Flow --      Pain Score 06/07/24 1244 6     Pain Loc --      Pain Education --      Exclude from Growth Chart --    No data found.  Updated Vital Signs BP (!) 138/92 (BP Location: Right Arm)   Pulse 68   Temp 98.1 F (36.7 C) (Oral)   Resp 20   SpO2 94%   Visual Acuity Right Eye Distance:   Left Eye Distance:   Bilateral Distance:    Right Eye Near:   Left Eye Near:    Bilateral Near:     Physical Exam Vitals and nursing note reviewed.   Constitutional:      Appearance: Normal appearance.  HENT:     Head: Atraumatic.  Eyes:     Extraocular Movements: Extraocular movements intact.     Conjunctiva/sclera: Conjunctivae normal.  Cardiovascular:     Rate and Rhythm: Normal rate.  Pulmonary:     Effort: Pulmonary effort is normal.  Musculoskeletal:        General: Tenderness present. No swelling or signs of injury. Normal range of motion.     Cervical back: Normal range of motion and neck supple.     Comments: Bilateral hands mildly tender to palpation, no erythema, appreciable edema, deformities.  Range of motion intact  Skin:    General: Skin is warm and dry.     Findings: Rash present.     Comments: Vascular rash as pictured to the lower extremities bilaterally consistent with prior pictures in chart from 2 days ago, no change noted on exam today  Neurological:     Mental Status: He is oriented to person, place, and time.     Comments: All 4 extremities neurovascularly intact  Psychiatric:        Mood and Affect: Mood normal.        Thought Content: Thought content normal.        Judgment: Judgment normal.      UC Treatments / Results  Labs (all labs ordered are listed, but only abnormal results are displayed) Labs Reviewed - No data to display  EKG   Radiology No results found.  Procedures Procedures (including critical care time)  Medications Ordered in UC Medications - No data to display  Initial Impression / Assessment and Plan / UC Course  I have reviewed the triage vital signs and the nursing notes.  Pertinent labs & imaging results that were available during my care of the patient were reviewed by me and considered in my medical decision making (see chart for details).     Vascular appearing rash to lower legs appear stable from prior visit pictures 2 days ago.  CBC, CMP were unremarkable, tick labs pending for further evaluation.  Patient wishes to continue holding the doxycycline  as he  feels this may have caused his hand pain stiffness and swelling.  States that the pain symptoms have been slowly improving since stopping the doxycycline  2 days ago.  He wishes  to continue to monitor conservatively at this time but agreeable to having prednisone  sent in in case the hand pain, swelling, stiffness starts to worsen.  Will adjust treatment if tick labs come back positive.  Return precautions reviewed at length.  Final Clinical Impressions(s) / UC Diagnoses   Final diagnoses:  Bilateral hand pain  Rash     Discharge Instructions      I have sent over the steroid in case your hand pain and swelling gets worse.  If you are more comfortable holding off on the doxycycline , in case it is causing the hand symptoms, this is reasonable and we will let you know if your tick titers come back positive and we need to consider restarting that medication.  Follow-up for worsening or unresolving symptoms in the meantime and you may do warm Epsom salt soaks, stretches, massage, over-the-counter pain relievers as needed.    ED Prescriptions     Medication Sig Dispense Auth. Provider   predniSONE  (DELTASONE ) 20 MG tablet Take 2 tablets (40 mg total) by mouth daily with breakfast. 10 tablet Stuart Vernell Norris, [place]      PDMP not reviewed this encounter.   Stuart Vernell Norris, [place] 06/07/24 1504

## 2024-06-07 NOTE — ED Triage Notes (Addendum)
 Pt reports after taking doxycline x 2 days ago he now has hand swelling and pain. States hands become numb at times and he hasn't been able to sleep   Took tylenol  and ibuprofen  but no relief

## 2024-06-07 NOTE — Discharge Instructions (Signed)
 I have sent over the steroid in case your hand pain and swelling gets worse.  If you are more comfortable holding off on the doxycycline , in case it is causing the hand symptoms, this is reasonable and we will let you know if your tick titers come back positive and we need to consider restarting that medication.  Follow-up for worsening or unresolving symptoms in the meantime and you may do warm Epsom salt soaks, stretches, massage, over-the-counter pain relievers as needed.
